# Patient Record
Sex: Female | Born: 2000 | Race: White | Hispanic: No | Marital: Single | Smoking: Never smoker
Health system: Southern US, Community
[De-identification: ages and names within clinical notes are randomized; demographics above are authoritative.]

## PROBLEM LIST (undated history)

## (undated) DIAGNOSIS — B379 Candidiasis, unspecified: Secondary | ICD-10-CM

## (undated) DIAGNOSIS — R112 Nausea with vomiting, unspecified: Secondary | ICD-10-CM

## (undated) DIAGNOSIS — F419 Anxiety disorder, unspecified: Secondary | ICD-10-CM

## (undated) DIAGNOSIS — F32A Depression, unspecified: Secondary | ICD-10-CM

## (undated) DIAGNOSIS — E559 Vitamin D deficiency, unspecified: Secondary | ICD-10-CM

## (undated) DIAGNOSIS — N83209 Unspecified ovarian cyst, unspecified side: Secondary | ICD-10-CM

## (undated) DIAGNOSIS — Z9889 Other specified postprocedural states: Secondary | ICD-10-CM

## (undated) DIAGNOSIS — F909 Attention-deficit hyperactivity disorder, unspecified type: Secondary | ICD-10-CM

## (undated) DIAGNOSIS — N898 Other specified noninflammatory disorders of vagina: Secondary | ICD-10-CM

## (undated) DIAGNOSIS — F329 Major depressive disorder, single episode, unspecified: Secondary | ICD-10-CM

## (undated) DIAGNOSIS — N926 Irregular menstruation, unspecified: Secondary | ICD-10-CM

## (undated) HISTORY — PX: ADENOIDECTOMY: SUR15

## (undated) HISTORY — DX: Irregular menstruation, unspecified: N92.6

## (undated) HISTORY — DX: Candidiasis, unspecified: B37.9

## (undated) HISTORY — PX: TONSILLECTOMY: SUR1361

## (undated) HISTORY — DX: Unspecified ovarian cyst, unspecified side: N83.209

## (undated) HISTORY — DX: Vitamin D deficiency, unspecified: E55.9

## (undated) HISTORY — DX: Other specified noninflammatory disorders of vagina: N89.8

---
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
## 2021-08-06 ENCOUNTER — Emergency Department (HOSPITAL_COMMUNITY)
Admission: EM | Admit: 2021-08-06 | Discharge: 2021-08-06 | Disposition: A | Discharge status: Home / Self Care | Payer: Medicaid Other | Attending: Emergency Medicine | Admitting: Emergency Medicine

## 2021-08-06 ENCOUNTER — Emergency Department (HOSPITAL_COMMUNITY): Payer: Medicaid Other

## 2021-08-06 ENCOUNTER — Other Ambulatory Visit: Payer: Self-pay

## 2021-08-06 ENCOUNTER — Encounter (HOSPITAL_COMMUNITY): Payer: Self-pay | Admitting: Emergency Medicine

## 2021-08-06 DIAGNOSIS — R1032 Left lower quadrant pain: Secondary | ICD-10-CM | POA: Diagnosis not present

## 2021-08-06 DIAGNOSIS — D72829 Elevated white blood cell count, unspecified: Secondary | ICD-10-CM | POA: Insufficient documentation

## 2021-08-06 DIAGNOSIS — R110 Nausea: Secondary | ICD-10-CM | POA: Insufficient documentation

## 2021-08-06 DIAGNOSIS — R102 Pelvic and perineal pain: Secondary | ICD-10-CM

## 2021-08-06 DIAGNOSIS — N761 Subacute and chronic vaginitis: Secondary | ICD-10-CM

## 2021-08-06 DIAGNOSIS — J45909 Unspecified asthma, uncomplicated: Secondary | ICD-10-CM | POA: Insufficient documentation

## 2021-08-06 DIAGNOSIS — Z79899 Other long term (current) drug therapy: Secondary | ICD-10-CM | POA: Insufficient documentation

## 2021-08-06 DIAGNOSIS — R1031 Right lower quadrant pain: Secondary | ICD-10-CM | POA: Insufficient documentation

## 2021-08-06 LAB — CBC WITH DIFFERENTIAL/PLATELET
Abs Immature Granulocytes: 0.04 10*3/uL (ref 0.00–0.07)
Basophils Absolute: 0 10*3/uL (ref 0.0–0.1)
Basophils Relative: 0 %
Eosinophils Absolute: 0.1 10*3/uL (ref 0.0–0.5)
Eosinophils Relative: 1 %
HCT: 37.9 % (ref 36.0–46.0)
Hemoglobin: 12.3 g/dL (ref 12.0–15.0)
Immature Granulocytes: 0 %
Lymphocytes Relative: 33 %
Lymphs Abs: 3.8 10*3/uL (ref 0.7–4.0)
MCH: 28 pg (ref 26.0–34.0)
MCHC: 32.5 g/dL (ref 30.0–36.0)
MCV: 86.1 fL (ref 80.0–100.0)
Monocytes Absolute: 1 10*3/uL (ref 0.1–1.0)
Monocytes Relative: 9 %
Neutro Abs: 6.6 10*3/uL (ref 1.7–7.7)
Neutrophils Relative %: 57 %
Platelets: 269 10*3/uL (ref 150–400)
RBC: 4.4 MIL/uL (ref 3.87–5.11)
RDW: 13.6 % (ref 11.5–15.5)
WBC: 11.5 10*3/uL — ABNORMAL HIGH (ref 4.0–10.5)
nRBC: 0 % (ref 0.0–0.2)

## 2021-08-06 LAB — COMPREHENSIVE METABOLIC PANEL
ALT: 43 U/L (ref 0–44)
AST: 27 U/L (ref 15–41)
Albumin: 3.8 g/dL (ref 3.5–5.0)
Alkaline Phosphatase: 66 U/L (ref 38–126)
Anion gap: 8 (ref 5–15)
BUN: 15 mg/dL (ref 6–20)
CO2: 24 mmol/L (ref 22–32)
Calcium: 8.9 mg/dL (ref 8.9–10.3)
Chloride: 106 mmol/L (ref 98–111)
Creatinine, Ser: 0.63 mg/dL (ref 0.44–1.00)
GFR, Estimated: >60 mL/min (ref >60)
Glucose, Bld: 99 mg/dL (ref 70–99)
Potassium: 3.7 mmol/L (ref 3.5–5.1)
Sodium: 138 mmol/L (ref 135–145)
Total Bilirubin: 0.6 mg/dL (ref 0.3–1.2)
Total Protein: 7.5 g/dL (ref 6.5–8.1)

## 2021-08-06 LAB — URINALYSIS, ROUTINE W REFLEX MICROSCOPIC
Bilirubin Urine: NEGATIVE
Glucose, UA: NEGATIVE mg/dL
Ketones, ur: NEGATIVE mg/dL
Leukocytes,Ua: NEGATIVE
Nitrite: NEGATIVE
Protein, ur: NEGATIVE mg/dL
Specific Gravity, Urine: 1.018 (ref 1.005–1.030)
pH: 7 (ref 5.0–8.0)

## 2021-08-06 LAB — WET PREP, GENITAL
Sperm: NONE SEEN
Trich, Wet Prep: NONE SEEN
Yeast Wet Prep HPF POC: NONE SEEN

## 2021-08-06 LAB — GC/CHLAMYDIA PROBE AMP (~~LOC~~) NOT AT ARMC
Chlamydia: NEGATIVE
Comment: NEGATIVE
Comment: NORMAL
Neisseria Gonorrhea: NEGATIVE

## 2021-08-06 LAB — PREGNANCY, URINE: Preg Test, Ur: NEGATIVE

## 2021-08-06 LAB — LIPASE, BLOOD: Lipase: 35 U/L (ref 11–51)

## 2021-08-06 MED ORDER — FENTANYL CITRATE PF 50 MCG/ML IJ SOSY
50.0000 ug | PREFILLED_SYRINGE | Freq: Once | INTRAMUSCULAR | Status: AC
  Administered 2021-08-06: 50 ug via INTRAVENOUS
  Filled 2021-08-06: qty 1

## 2021-08-06 MED ORDER — ONDANSETRON HCL 4 MG/2ML IJ SOLN
4.0000 mg | Freq: Once | INTRAMUSCULAR | Status: AC
Start: 2021-08-06 — End: 2021-08-06
  Administered 2021-08-06: 4 mg via INTRAVENOUS
  Filled 2021-08-06: qty 2

## 2021-08-06 MED ORDER — IOHEXOL 300 MG/ML  SOLN
100.0000 mL | Freq: Once | INTRAMUSCULAR | Status: AC | PRN
  Administered 2021-08-06: 100 mL via INTRAVENOUS

## 2021-08-06 MED ORDER — LACTATED RINGERS IV BOLUS
1000.0000 mL | Freq: Once | INTRAVENOUS | Status: AC
  Administered 2021-08-06: 1000 mL via INTRAVENOUS

## 2021-08-06 MED ORDER — KETOROLAC TROMETHAMINE 30 MG/ML IJ SOLN
30.0000 mg | Freq: Once | INTRAMUSCULAR | Status: AC
Start: 2021-08-06 — End: 2021-08-06
  Administered 2021-08-06: 30 mg via INTRAVENOUS
  Filled 2021-08-06: qty 1

## 2021-08-06 MED ORDER — METRONIDAZOLE 500 MG PO TABS: 500.0000 mg | ORAL_TABLET | Freq: Two times a day (BID) | ORAL | 0 refills | Status: AC

## 2021-08-06 NOTE — ED Notes (Signed)
Patient transported to CT 

## 2021-08-06 NOTE — Discharge Instructions (Signed)
Follow-up with your GYN doctor next week for recheck.  Take Tylenol or Motrin for pain

## 2021-08-06 NOTE — ED Provider Notes (Signed)
[facility] EMERGENCY DEPARTMENT Provider Note   CSN: [id number] Arrival date & time: 08/06/21  0202     History  Chief Complaint  Patient presents with   Abdominal Pain    Kathy Green is a 21 y.o. female.  The history is provided by the patient.  Abdominal Pain Pain location:  LLQ Pain quality: sharp   Pain radiates to:  R flank and RLQ Pain severity:  Severe Onset quality:  Sudden Duration:  2 days Timing:  Constant Progression:  Worsening Chronicity:  New Relieved by:  Nothing Worsened by:  Movement and palpation Associated symptoms: nausea   Associated symptoms: no constipation, no diarrhea, no dysuria, no fever, no hematochezia, no vaginal bleeding and no vomiting   Patient with previous history of ovarian cyst presents with abdominal pain.  She reports over 2 days ago she was at work in the bathroom when she had onset of left lower quadrant pain.  Since that time is radiating to her right side and her right flank.  No fevers or vomiting, but she does report nausea.  She reports has had a normal bowel movement without any blood.  She has never really had this pain before   Past Medical History:  Diagnosis Date   ADHD (attention deficit hyperactivity disorder)    Anxiety    Asthma    Depression    Irregular bleeding 09/13/2015   Ovarian cyst    PONV (postoperative nausea and vomiting)    Vaginal discharge 09/13/2015   Vitamin D deficiency 09/17/2015   Yeast infection 09/13/2015    Home Medications Prior to Admission medications   Medication Sig Start Date End Date Taking? Authorizing Provider  acetaminophen (TYLENOL) 325 MG tablet Take 2 tablets (650 mg total) by mouth every 6 (six) hours as needed for mild pain or moderate pain. 12/29/18   Reasor, [place], MD  albuterol (PROAIR HFA) 108 (90 BASE) MCG/ACT inhaler Inhale 2 puffs into the lungs every 6 (six) hours as needed for wheezing or shortness of breath.     [provider]  diphenhydrAMINE  (BENADRYL) 25 MG tablet Take 1 tablet (25 mg total) by mouth every 6 (six) hours as needed for itching. Patient taking differently: Take 25 mg by mouth every 6 (six) hours as needed for allergies.  11/15/17   Loren Racer, MD  docusate sodium (COLACE) 100 MG capsule Take 1 capsule (100 mg total) by mouth 2 (two) times daily. 12/30/18   Reasor, [place], MD  ibuprofen (ADVIL) 800 MG tablet Take 1 tablet (800 mg total) by mouth every 8 (eight) hours as needed. 01/23/19   Bethann Berkshire, MD  naproxen (NAPROSYN) 500 MG tablet Take 1 tablet (500 mg total) by mouth 2 (two) times daily with a meal. 05/09/19   Eber Hong, MD  pantoprazole (PROTONIX) 40 MG tablet Take 1 tablet (40 mg total) by mouth daily. 01/02/19 02/01/19  Alexander Mt, MD      Allergies    Amoxicillin-pot clavulanate and Penicillins    Review of Systems   Review of Systems  Constitutional:  Negative for fever.  Gastrointestinal:  Positive for abdominal pain and nausea. Negative for constipation, diarrhea, hematochezia and vomiting.  Genitourinary:  Negative for dysuria and vaginal bleeding.  All other systems reviewed and are negative.  Physical Exam Updated Vital Signs BP 105/61 (BP Location: Right Arm)    Pulse 95    Temp 98 F (36.7 C) (Oral)    Resp 15    Ht 1.702  m (5\' 7" )    Wt 132 kg    LMP 07/20/2021    SpO2 96%    BMI 45.58 kg/m  Physical Exam CONSTITUTIONAL: Well developed/well nourished, uncomfortable appearing HEAD: Normocephalic/atraumatic EYES: EOMI/PERRL ENMT: Mucous membranes moist NECK: supple no meningeal signs SPINE/BACK:entire spine nontender CV: S1/S2 noted, no murmurs/rubs/gallops noted LUNGS: Lungs are clear to auscultation bilaterally, no apparent distress ABDOMEN: soft, moderate LLQ and RLQ tenderness, no rebound or guarding, bowel sounds noted throughout abdomen GU: Right cva tenderness Pelvic with nurse present, reveals no CMT, whitish discharge but no vaginal bleeding.  Right adnexal  tenderness.  Exam limited by body habitus NEURO: Pt is awake/alert/appropriate, moves all extremitiesx4.  No facial droop.   EXTREMITIES: pulses normal/equal, full ROM SKIN: warm, color normal PSYCH: no abnormalities of mood noted, alert and oriented to situation  ED Results / Procedures / Treatments   Labs (all labs ordered are listed, but only abnormal results are displayed) Labs Reviewed  WET PREP, GENITAL - Abnormal; Notable for the following components:      Result Value   Clue Cells Wet Prep HPF POC PRESENT (*)    WBC, Wet Prep HPF POC FEW (*)    All other components within normal limits  CBC WITH DIFFERENTIAL/PLATELET - Abnormal; Notable for the following components:   WBC 11.5 (*)    All other components within normal limits  URINALYSIS, ROUTINE W REFLEX MICROSCOPIC - Abnormal; Notable for the following components:   Hgb urine dipstick SMALL (*)    Bacteria, UA FEW (*)    All other components within normal limits  COMPREHENSIVE METABOLIC PANEL  LIPASE, BLOOD  PREGNANCY, URINE  GC/CHLAMYDIA PROBE AMP ([employer]) NOT AT [facility]    EKG None  Radiology CT ABDOMEN PELVIS W CONTRAST  Result Date: 08/06/2021 CLINICAL DATA:  Lower right abdominal pain radiating across the abdomen since Sunday with nausea EXAM: CT ABDOMEN AND PELVIS WITH CONTRAST TECHNIQUE: Multidetector CT imaging of the abdomen and pelvis was performed using the standard protocol following bolus administration of intravenous contrast. RADIATION DOSE REDUCTION: This exam was performed according to the departmental dose-optimization program which includes automated exposure control, adjustment of the mA and/or kV according to patient size and/or use of iterative reconstruction technique. CONTRAST:  Thursday OMNIPAQUE IOHEXOL 300 MG/ML  SOLN COMPARISON:  12/23/2018 abdominal CT FINDINGS: Lower chest:  No contributory findings. Hepatobiliary: No focal liver abnormality.No evidence of biliary obstruction or stone. Pancreas:  Unremarkable. Spleen: Unremarkable. Adrenals/Urinary Tract: Negative adrenals. No hydronephrosis or stone. Unremarkable bladder. Stomach/Bowel:  No obstruction. No appendicitis. Vascular/Lymphatic: No acute vascular abnormality. No mass or adenopathy. Reproductive:3 cm probable dominant follicle on the left, contralateral to the symptomatic side. Other: No ascites or pneumoperitoneum. Musculoskeletal: No acute abnormalities. IMPRESSION: Negative for appendicitis or other cause of symptoms. Electronically Signed   By: 12/25/2018 M.D.   On: 08/06/2021 04:51    Procedures Procedures    Medications Ordered in ED Medications  ketorolac (TORADOL) 30 MG/ML injection 30 mg (has no administration in time range)  fentaNYL (SUBLIMAZE) injection 50 mcg (50 mcg Intravenous Given 08/06/21 0423)  ondansetron (ZOFRAN) injection 4 mg (4 mg Intravenous Given 08/06/21 0421)  lactated ringers bolus 1,000 mL (1,000 mLs Intravenous New Bag/Given 08/06/21 0426)  iohexol (OMNIPAQUE) 300 MG/ML solution 100 mL (100 mLs Intravenous Contrast Given 08/06/21 0430)    ED Course/ Medical Decision Making/ A&P Clinical Course as of 08/06/21 0707  Wed Aug 06, 2021  0408 WBC(!): 11.5 Leukocytosis [DW]  [phone]  CT scan was personally reviewed and is negative for acute abdominal emergency.  Patient is still having abdominal pain.  Pelvic exam was performed with nurse present, revealed right adnexal tenderness.  No CMT.  Plan for pelvic ultrasound to evaluate for TOA/torsion [DW]  0706 SIGNED OUT TO Dr zammit at shift change to f/u on US pelvis [DW]    Clinical Course User Index [DW] Zadie Rhine, MD                           Medical Decision Making Amount and/or Complexity of Data Reviewed Labs: ordered. Decision-making details documented in ED Course. Radiology: ordered. ECG/medicine tests: ordered.  Risk Prescription drug management.   This patient presents to the ED for concern of abdominal pain, this involves an  extensive number of treatment options, and is a complaint that carries with it a high risk of complications and morbidity.  The differential diagnosis includes ovarian cyst, cyst rupture, TOA, ovarian torsion, UTI, pyelonephritis, kidney stone, appendicitis, bowel obstruction, diverticulitis  Comorbidities that complicate the patient evaluation: Patients presentation is complicated by their history of ovarian cyst and obesity  Additional history obtained: Additional history obtained from  mother Records reviewed previous radiology  Lab Tests: I Ordered, and personally interpreted labs.  The pertinent results include: Leukocytosis  Imaging Studies ordered: I ordered imaging studies including CT scan abdomen/pelvis I independently visualized and interpreted imaging which showed no acute findings I agree with the radiologist interpretation    Medicines ordered and prescription drug management: I ordered medication including IV fentanyl for pain Reevaluation of the patient after these medicines showed that the patient    improved  Reevaluation: After the interventions noted above, I reevaluated the patient and found that they have :improved  Complexity of problems addressed: Patients presentation is most consistent with  acute presentation with potential threat to life or bodily function              Final Clinical Impression(s) / ED Diagnoses Final diagnoses:  Left lower quadrant abdominal pain  Pelvic pain    Rx / DC Orders ED Discharge Orders     None         Zadie Rhine, MD 08/06/21 [phone]

## 2021-08-06 NOTE — ED Triage Notes (Signed)
Pt c/o lower right abd pain that radiates across abd since Sunday with nausea.

## 2021-08-29 ENCOUNTER — Other Ambulatory Visit (HOSPITAL_COMMUNITY): Payer: Self-pay | Admitting: Orthopedic Surgery

## 2021-08-29 ENCOUNTER — Other Ambulatory Visit: Payer: Self-pay

## 2021-08-29 ENCOUNTER — Ambulatory Visit (HOSPITAL_COMMUNITY)
Admission: RE | Admit: 2021-08-29 | Discharge: 2021-08-29 | Disposition: A | Discharge status: Home / Self Care | Payer: Medicaid Other | Source: Ambulatory Visit | Attending: Orthopedic Surgery | Admitting: Orthopedic Surgery

## 2021-08-29 DIAGNOSIS — M7989 Other specified soft tissue disorders: Secondary | ICD-10-CM

## 2021-08-29 DIAGNOSIS — M79605 Pain in left leg: Secondary | ICD-10-CM | POA: Insufficient documentation

## 2021-08-29 NOTE — Progress Notes (Signed)
Lower extremity venous LT study completed.   Please see CV Proc for preliminary results.   Eliodoro Gullett, RDMS, RVT  

## 2021-09-17 NOTE — Therapy (Addendum)
?OUTPATIENT PHYSICAL THERAPY LOWER EXTREMITY EVALUATION ? ? ?Patient Name: Kathy Green ?MRN: [id number] ?DOB:[birth year], 20 y.o., female ?Today's Date: 09/18/2021 ? ? PT End of Session - 09/18/21 [id number]   ? ? Visit Number 1   ? Number of Visits 8   ? Date for PT Re-Evaluation 10/17/21   ? Authorization Type [place] [employer] Healthy   ? Authorization - Visit Number 1   ? Authorization - Number of Visits 8   ? Progress Note Due on Visit 8   ? PT Start Time [phone]   ? PT Stop Time 1030   ? PT Time Calculation (min) 36 min   ? ?  ?  ? ?  ? ? ?Past Medical History:  ?Diagnosis Date  ? ADHD (attention deficit hyperactivity disorder)   ? Anxiety   ? Asthma   ? Depression   ? Irregular bleeding 09/13/2015  ? Ovarian cyst   ? PONV (postoperative nausea and vomiting)   ? Vaginal discharge 09/13/2015  ? Vitamin D deficiency 09/17/2015  ? Yeast infection 09/13/2015  ? ?Past Surgical History:  ?Procedure Laterality Date  ? ADENOIDECTOMY    ? TONSILLECTOMY    ? TOOTH EXTRACTION Bilateral 09/09/2018  ? Procedure: EXTRACTION 3RD MOLARS;  Surgeon: Diona Browner, DDS;  Location: [place];  Service: Oral Surgery;  Laterality: Bilateral;  EXTRACTION 3RD MOLARS  ? ?Patient Active Problem List  ? Diagnosis Date Noted  ? Constipation 01/01/2019  ? Vomiting 12/28/2018  ? Abdominal pain 12/28/2018  ? MDD (major depressive disorder), recurrent episode, severe ([place]) 08/04/2018  ? Vitamin D deficiency 09/17/2015  ? Vaginal discharge 09/13/2015  ? Yeast infection 09/13/2015  ? Irregular bleeding 09/13/2015  ? GROIN PAIN 03/13/2010  ? 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 ? ?PCP: Sharilyn Sites, MD ? ?REFERRING PROVIDER: Earlie Server, MD ? ?REFERRING DIAG: Left knee pain ? ?THERAPY DIAG: PT eval and tx Left knee pain ? PF _____  vs lateral menisous per Dr French Ana  ?Acute pain of left knee ? ?Difficulty in walking, not elsewhere classified ? ?ONSET DATE: Feb 4th, 2023 ? ?SUBJECTIVE:  ? ?SUBJECTIVE STATEMENT: ?Patient states she was stepping out of the shower and stepped  in a wet spot and her knee went behind her and hyperextended.  She states she felt a "pop" and had immediate pain. She is wearing a brace today.  She states she went to [facility] as she is familiar with them about 2 weeks after the injury. X-ray was negative for fracture and is waiting for MRI. Follows up with [facility] 10/06/21 ? ?PERTINENT HISTORY: ?Had some knee pain from Left ankle fracture at 21 years old ? ?PAIN:  ?Are you having pain? Yes: NPRS scale: 8/10 ?Pain location: L knee pain, anterior ?Pain description: throbbing, stinging ?Aggravating factors: walking ?Relieving factors: laying down, sleeping ? ?PRECAUTIONS: Fall ? ?WEIGHT BEARING RESTRICTIONS No ? ?FALLS:  ?Has patient fallen in last 6 months? Yes, Number of falls: 1 ? ?LIVING ENVIRONMENT: ?Lives with: lives with their family ?Lives in: Mobile home ?Stairs: Yes: External: 4 steps; can reach both ?Has following equipment at home: None ? ?OCCUPATION: currently not working ? ?PLOF: Independent ? ?PATIENT GOALS to get out of the brace and back to normal ? ? ?OBJECTIVE:  ? ?DIAGNOSTIC FINDINGS: xray; neg ? ? ?COGNITION: ? Overall cognitive status: Within functional limits for tasks assessed   ?  ?SENSATION: ?WFL ? ? ?POSTURE:  ?wfl ? ?PALPATION: ?Tender lateral knee structures; LCL as well as around patella; guarding with  all knee movement.  ? ?LE ROM: ? ?Active ROM Right ?09/18/2021 Left ?09/18/2021  ?Hip flexion    ?Hip extension    ?Hip abduction    ?Hip adduction    ?Hip internal rotation    ?Hip external rotation    ?Knee flexion  82 AAROM supine  ?Knee extension hyperextended -12 supine  ?Ankle dorsiflexion    ?Ankle plantarflexion    ?Ankle inversion    ?Ankle eversion    ? (Blank rows = not tested) ? ?LE MMT: ? ?MMT Right ?09/18/2021 Left ?09/18/2021  ?Hip flexion 5 3+  ?Hip extension    ?Hip abduction    ?Hip adduction    ?Hip internal rotation    ?Hip external rotation    ?Knee flexion 5 3-  ?Knee extension 5 3-  ?Ankle dorsiflexion 5 4  ?Ankle  plantarflexion    ?Ankle inversion    ?Ankle eversion    ? (Blank rows = not tested) ? ? ?FUNCTIONAL TESTS:  ?30 seconds chair stand test x 3 ? ?GAIT: ?Distance walked: 50 ?Assistive device utilized: None ?Level of assistance: SBA ?Comments: antalgic gait; decreased stance Left lower extremity; decreased gait speed ? ? ? ?TODAY'S TREATMENT: ?PT evaluation, HEP instruction ? ? ?PATIENT EDUCATION:  ?Education details: HEP, use of ice for pain management ?Person educated: Patient ?Education method: Explanation, Demonstration, and Handouts ?Education comprehension: verbalized understanding, returned demonstration, and needs further education ? ? ?HOME EXERCISE PROGRAM: ? ?Ankle pumps, quad sets, heel slides in supine and sitting ? ?ASSESSMENT: ? ?CLINICAL IMPRESSION: ?Patient is a 21 y.o. female who was seen today for physical therapy evaluation and treatment for Left knee pain.  ? ? ?OBJECTIVE IMPAIRMENTS Abnormal gait, decreased activity tolerance, decreased balance, decreased endurance, decreased mobility, difficulty walking, decreased ROM, decreased strength, increased edema, impaired perceived functional ability, impaired flexibility, and pain.  ? ?ACTIVITY LIMITATIONS cleaning, community activity, driving, meal prep, occupation, laundry, yard work, shopping, and yard work.  ? ?PERSONAL FACTORS Fitness are also affecting patient's functional outcome.  ? ? ?REHAB POTENTIAL: Good ? ?CLINICAL DECISION MAKING: Stable/uncomplicated ? ?EVALUATION COMPLEXITY: Low ? ? ?GOALS: ?Goals reviewed with patient? Yes ? ?SHORT TERM GOALS: Target date: 10/02/2021 ? ?Patient will be independent in initial HEP to improve functional outcomes. ? ?Goal status: INITIAL ? ?2.  Patient will improve Left knee AAROM flexion in supine to 100 degrees to improve ability to squat, navigate steps ?Baseline: Left knee AAROM flexion 82 degrees in supine ?Goal status: INITIAL ? ?3.  Patient will improve Left knee AROM extension to -8 in supine to  improve ability to fire quad to decrease fall risk with stance phase of ambulation.  ?Baseline: Left knee AROM extension -12 in supine ?Goal status: INITIAL ? ? ?LONG TERM GOALS: Target date: 10/16/2021 ? ?Long term goal: Patient will be independent with advanced HEP and self management strategies to improve quality of life and functional outcomes. ? ? ?Goal status: INITIAL ? ?2.   Patient with improved Left  knee AROM to 0-120 to be able to safely navigate stairs ? ?Baseline: -12 to 82 ?Goal status: INITIAL ? ?3.  Long term goal: Patient will increase Left knee MMTs to 5/5 to promote return to ambulation community distances with minimal deviation. ?Baseline: 3-/5 KF/KE ?Goal status: INITIAL ? ?4.  Patient will improve 30 sec STS score from 3 to 6 to demonstrate increased lower extremity strength and improved functional mobilty  ?Baseline: 30 sec STS x 3 ?Goal status: INITIAL ? ? ?PLAN: ?PT FREQUENCY:  2x/week ? ?PT DURATION: 4 weeks ? ?PLANNED INTERVENTIONS: Therapeutic exercises, Therapeutic activity, Neuromuscular re-education, Balance training, Gait training, Patient/Family education, Joint manipulation, Joint mobilization, Stair training, Orthotic/Fit training, DME instructions, Aquatic Therapy, Electrical stimulation, Spinal manipulation, Spinal mobilization, Cryotherapy, Taping, Ultrasound, Biofeedback, Ionotophoresis 4mg /ml Dexamethasone, and Manual therapy ? ?PLAN FOR NEXT SESSION: Review of HEP and goals, progress Left knee mobility and strength as able.  ? ? ?11:41 AM, 09/18/21 ?Novice Vrba Small Hilari Wethington MPT ?[facility] ?[place] [phone] ?Ph:[phone] ? ?

## 2021-09-18 ENCOUNTER — Ambulatory Visit (HOSPITAL_COMMUNITY): Payer: Medicaid Other | Attending: Orthopedic Surgery

## 2021-09-18 ENCOUNTER — Other Ambulatory Visit: Payer: Self-pay

## 2021-09-18 DIAGNOSIS — R262 Difficulty in walking, not elsewhere classified: Secondary | ICD-10-CM | POA: Diagnosis present

## 2021-09-18 DIAGNOSIS — M25562 Pain in left knee: Secondary | ICD-10-CM | POA: Diagnosis not present

## 2021-09-18 NOTE — Addendum Note (Signed)
Addended byKarn Cassis S on: 09/18/2021 11:48 AM ? ? Modules accepted: Orders ? ?

## 2021-09-22 ENCOUNTER — Other Ambulatory Visit: Payer: Self-pay

## 2021-09-22 ENCOUNTER — Ambulatory Visit (HOSPITAL_COMMUNITY): Payer: Medicaid Other | Admitting: Physical Therapy

## 2021-09-22 DIAGNOSIS — M25562 Pain in left knee: Secondary | ICD-10-CM | POA: Diagnosis not present

## 2021-09-22 NOTE — Therapy (Signed)
?OUTPATIENT PHYSICAL THERAPY TREATMENT NOTE ? ? ?Patient Name: Kathy Green ?MRN: [id number] ?DOB:04/29/2001, 21 y.o., female ?Today's Date: 09/22/2021 ? ?PCP: Assunta Found, MD ?REFERRING PROVIDER: Assunta Found, MD ? ? PT End of Session - 09/22/21 1124   ? ? Visit Number 2   ? Number of Visits 8   ? Date for PT Re-Evaluation 10/17/21   ? Authorization Type [place] Medicaid Healthy  auth not in at this time   ? Authorization - Visit Number 2   ? Authorization - Number of Visits 8   ? Progress Note Due on Visit 8   ? PT Start Time 1450   ? PT Stop Time 1530   ? PT Time Calculation (min) 40 min   ? ?  ?  ? ?  ? ? ?Past Medical History:  ?Diagnosis Date  ? ADHD (attention deficit hyperactivity disorder)   ? Anxiety   ? Asthma   ? Depression   ? Irregular bleeding 09/13/2015  ? Ovarian cyst   ? PONV (postoperative nausea and vomiting)   ? Vaginal discharge 09/13/2015  ? Vitamin D deficiency 09/17/2015  ? Yeast infection 09/13/2015  ? ?Past Surgical History:  ?Procedure Laterality Date  ? ADENOIDECTOMY    ? TONSILLECTOMY    ? TOOTH EXTRACTION Bilateral 09/09/2018  ? Procedure: EXTRACTION 3RD MOLARS;  Surgeon: Ocie Doyne, DDS;  [facility]: [facility];  Service: Oral Surgery;  Laterality: Bilateral;  EXTRACTION 3RD MOLARS  ? ?Patient Active Problem List  ? Diagnosis Date Noted  ? Constipation 01/01/2019  ? Vomiting 12/28/2018  ? Abdominal pain 12/28/2018  ? MDD (major depressive disorder), recurrent episode, severe ([facility]) 08/04/2018  ? Vitamin D deficiency 09/17/2015  ? Vaginal discharge 09/13/2015  ? Yeast infection 09/13/2015  ? Irregular bleeding 09/13/2015  ? GROIN PAIN 03/13/2010  ? 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 ? ?REFERRING DIAG: Left knee pain ?  ? ?THERAPY DIAG:  ?Left knee pain ? ?PERTINENT HISTORY: Had some knee pain from Left ankle fracture at 21 years old ?  ? ?PRECAUTIONS: fall ? ?SUBJECTIVE: PT states that she did her exercises once a day.  She is hurting today. ? ?PAIN:  ?Are you having pain? Yes: NPRS scale: 8/10 ?Pain  location: knee ?Pain description: throbbing  ?Aggravating factors: activity  ?Relieving factors: ice  ? ? ? ? ?  ?  ?TODAY'S TREATMENT: ?            Bike x 5' ?            Standing heel raises x 10 ?                           Mini squat x 10 ?            Sitting     LAQ x 10 ?                           Sit to stand x 10 ?            Supine:  quad set x 10 ?                          Heelslide x 10 ?                          SLR  x 10  ?  SAQ x 10  ?           Side lying hip abduction x 10  ?                           Hip adduction x 10  ?           Prone knee flexion x10 ?                      Hip extension x 10 ?          ?  ?PATIENT EDUCATION:  ?Education details: HEP, use of ice for pain management ?Person educated: Patient ?Education method: Explanation, Demonstration, and Handouts ?Education comprehension: verbalized understanding, returned demonstration, and needs further education ?  ?  ?HOME EXERCISE PROGRAM: ?  ?Ankle pumps, quad sets, heel slides in supine and sitting ?3/27:  Standing heel raises ?                           Mini squat ?            Sitting     LAQ  ?            Supine:   SAQ x 10  ?                          SLR  x 10  ?                            ?ASSESSMENT: ?  ?CLINICAL IMPRESSION: ?Evaluation and goals reviewed with patient.  Pt HEP progressed to increase strength. Current AROM 0-118. ?  ?  ?OBJECTIVE IMPAIRMENTS Abnormal gait, decreased activity tolerance, decreased balance, decreased endurance, decreased mobility, difficulty walking, decreased ROM, decreased strength, increased edema, impaired perceived functional ability, impaired flexibility, and pain.  ?  ?ACTIVITY LIMITATIONS cleaning, community activity, driving, meal prep, occupation, laundry, yard work, shopping, and yard work.  ?  ?PERSONAL FACTORS Fitness are also affecting patient's functional outcome.  ?  ?  ?REHAB POTENTIAL: Good ?  ?CLINICAL DECISION MAKING: Stable/uncomplicated ?  ?EVALUATION  COMPLEXITY: Low ?  ?  ?GOALS: ?Goals reviewed with patient? Yes ?  ?SHORT TERM GOALS: Target date: 10/02/2021 ?  ?Patient will be independent in initial HEP to improve functional outcomes. ?  ?Goal status: achieved ?  ?2.  Patient will improve Left knee AAROM flexion in supine to 100 degrees to improve ability to squat, navigate steps ?Baseline: Left knee AAROM flexion 82 degrees in supine ?Goal status: achieved ?  ?3.  Patient will improve Left knee AROM extension to -8 in supine to improve ability to fire quad to decrease fall risk with stance phase of ambulation.  ?Baseline: Left knee AROM extension -12 in supine ?Goal status: achieved  ?  ?  ?LONG TERM GOALS: Target date: 10/16/2021 ?  ?Long term goal: Patient will be independent with advanced HEP and self management strategies to improve quality of life and functional outcomes. ?  ?  ?Goal status: ongoing ?  ?2.   Patient with improved Left  knee AROM to 0-120 to be able to safely navigate stairs ?  ?Baseline: -12 to 82 ?Goal status: ongoing  ?  ?3.  Long term goal: Patient will increase Left knee MMTs to 5/5 to promote return to ambulation community  distances with minimal deviation. ?Baseline: 3-/5 KF/KE ?Goal status: INITIAL ?  ?4.  Patient will improve 30 sec STS score from 3 to 6 to demonstrate increased lower extremity strength and improved functional mobilty  ?Baseline: 30 sec STS x 3 ?Goal status: achieved  ?  ?  ?PLAN: ?PT FREQUENCY: 2x/week ?  ?PT DURATION: 4 weeks ?  ?PLANNED INTERVENTIONS: Therapeutic exercises, Therapeutic activity, Neuromuscular re-education, Balance training, Gait training, Patient/Family education, Joint manipulation, Joint mobilization, Stair training, Orthotic/Fit training, DME instructions, Aquatic Therapy, Electrical stimulation, Spinal manipulation, Spinal mobilization, Cryotherapy, Taping, Ultrasound, Biofeedback, Ionotophoresis 4mg /ml Dexamethasone, and Manual therapy ?  ?PLAN FOR NEXT SESSION: progress Left knee mobility and  strength as able.  ?  ?  ? ?Virgina OrganCynthia Hanadi Stanly, PT CLT ?[phone]  ?09/22/2021, 3:30PM ? ?   ?

## 2021-09-25 ENCOUNTER — Ambulatory Visit (HOSPITAL_COMMUNITY): Payer: Medicaid Other | Admitting: Physical Therapy

## 2021-09-30 ENCOUNTER — Telehealth (HOSPITAL_COMMUNITY): Payer: Self-pay | Admitting: Physical Therapy

## 2021-09-30 ENCOUNTER — Encounter (HOSPITAL_COMMUNITY): Payer: Medicaid Other | Admitting: Physical Therapy

## 2021-10-01 ENCOUNTER — Ambulatory Visit (HOSPITAL_COMMUNITY): Payer: Medicaid Other | Attending: Orthopedic Surgery | Admitting: Physical Therapy

## 2021-10-01 DIAGNOSIS — R262 Difficulty in walking, not elsewhere classified: Secondary | ICD-10-CM | POA: Insufficient documentation

## 2021-10-01 DIAGNOSIS — M25562 Pain in left knee: Secondary | ICD-10-CM | POA: Diagnosis present

## 2021-10-01 NOTE — Therapy (Signed)
?OUTPATIENT PHYSICAL THERAPY TREATMENT NOTE ? ? ?Patient Name: Kathy Green ?MRN: [id number] ?DOB:August 10, 2000, 21 y.o., female ?Today's Date: 10/01/2021 ? ?PCP: Assunta Found, MD ?REFERRING PROVIDER: Frederico Hamman, MD ? ? PT End of Session - 10/01/21 1554   ? ? Visit Number 3   ? Number of Visits 8   ? Date for PT Re-Evaluation 10/17/21   ? Authorization Type [place] Medicaid Healthy  auth not in at this time   ? Authorization - Visit Number 3   ? Authorization - Number of Visits 8   ? Progress Note Due on Visit 8   ? PT Start Time 1557   ? PT Stop Time 1640   ? PT Time Calculation (min) 43 min   ? ?  ?  ? ?  ? ? ?Past Medical History:  ?Diagnosis Date  ? ADHD (attention deficit hyperactivity disorder)   ? Anxiety   ? Asthma   ? Depression   ? Irregular bleeding 09/13/2015  ? Ovarian cyst   ? PONV (postoperative nausea and vomiting)   ? Vaginal discharge 09/13/2015  ? Vitamin D deficiency 09/17/2015  ? Yeast infection 09/13/2015  ? ?Past Surgical History:  ?Procedure Laterality Date  ? ADENOIDECTOMY    ? TONSILLECTOMY    ? TOOTH EXTRACTION Bilateral 09/09/2018  ? Procedure: EXTRACTION 3RD MOLARS;  Surgeon: Ocie Doyne, DDS;  Location: [facility] OR;  Service: Oral Surgery;  Laterality: Bilateral;  EXTRACTION 3RD MOLARS  ? ?Patient Active Problem List  ? Diagnosis Date Noted  ? Constipation 01/01/2019  ? Vomiting 12/28/2018  ? Abdominal pain 12/28/2018  ? MDD (major depressive disorder), recurrent episode, severe ([facility]) 08/04/2018  ? Vitamin D deficiency 09/17/2015  ? Vaginal discharge 09/13/2015  ? Yeast infection 09/13/2015  ? Irregular bleeding 09/13/2015  ? GROIN PAIN 03/13/2010  ? 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 ? ?REFERRING DIAG: Left knee pain ?  ? ?THERAPY DIAG:  ?Left knee pain ? ?PERTINENT HISTORY: Had some knee pain from Left ankle fracture at 21 years old ?  ? ?PRECAUTIONS: fall ? ?SUBJECTIVE: PT states she is having a lot of pain today and the brace will not stay on because the velcro will not hold.  States she returns to [facility]  Monday.  ? ?OBJECTIVE: ? ?PAIN:  ?Are you having pain? Yes: NPRS scale: 10/10 ?Pain location: Lt Knee ?Pain description: throbbing  ?Aggravating factors: activity  ?Relieving factors: ice  ? ? ?  ?  ?TODAY'S TREATMENT: ?       10/01/21 ? Bike full revolutions 5 minutes ? Standing: Heelraises 20X   ?  Knee flexion Lt 15X ?  Hip abduction bil 15X ?  Hip extension bil 15X ?  High march hold 3" 15X ? Supine: bridge 10X ?  SLR 10X ?  Heelslides 10X ?AROM 0-115 Lt LE ? ?09/22/21 ?Bike x 5' ?            Standing heel raises x 10 ?                           Mini squat x 10 ?            Sitting     LAQ x 10 ?                           Sit to stand x 10 ?  Supine:  quad set x 10 ?                          Heelslide x 10 ?                          SLR  x 10  ?                           SAQ x 10  ?           Side lying hip abduction x 10  ?                           Hip adduction x 10  ?           Prone knee flexion x10 ?                      Hip extension x 10 ?          ?  ?PATIENT EDUCATION:  ?Education details: HEP, use of ice for pain management ?Person educated: Patient ?Education method: Explanation, Demonstration, and Handouts ?Education comprehension: verbalized understanding, returned demonstration, and needs further education ?  ?  ?HOME EXERCISE PROGRAM: ?  ?3/27:  Standing heel raises ?                           Mini squat ?            Sitting     LAQ  ?            Supine:   SAQ x 10  ?                          SLR  x 10  ?At evalution: Ankle pumps, quad sets, heel slides in supine and sitting ?                            ?ASSESSMENT: ?  ?CLINICAL IMPRESSION: ?Pt with increased pain this session.  Later in treatment reported she went to the gym following her last PT appt and thinks she overdone it with the lunges as she described completing these ballistically.  Pt unable to complete squats today due to discomfort.  Was able to add lunges and begin hip strengthening in standing as noted weakness per MMT at  evaluation.  Pt has returned to work at [employer] but is able to complete her job in sitting.   ?  ?  ?OBJECTIVE IMPAIRMENTS Abnormal gait, decreased activity tolerance, decreased balance, decreased endurance, decreased mobility, difficulty walking, decreased ROM, decreased strength, increased edema, impaired perceived functional ability, impaired flexibility, and pain.  ?  ?ACTIVITY LIMITATIONS cleaning, community activity, driving, meal prep, occupation, laundry, yard work, shopping, and yard work.  ?  ?PERSONAL FACTORS Fitness are also affecting patient's functional outcome.  ?  ?  ?REHAB POTENTIAL: Good ?  ?CLINICAL DECISION MAKING: Stable/uncomplicated ?  ?EVALUATION COMPLEXITY: Low ?  ?  ?GOALS: ?Goals reviewed with patient? Yes ?  ?SHORT TERM GOALS: Target date: 10/02/2021 ?  ?Patient will be independent in initial HEP to improve functional outcomes. ?  ?Goal status: achieved ?  ?2.  Patient will improve Left knee  AAROM flexion in supine to 100 degrees to improve ability to squat, navigate steps ?Baseline: Left knee AAROM flexion 82 degrees in supine ?Goal status: achieved ?  ?3.  Patient will improve Left knee AROM extension to -8 in supine to improve ability to fire quad to decrease fall risk with stance phase of ambulation.  ?Baseline: Left knee AROM extension -12 in supine ?Goal status: achieved  ?  ?  ?LONG TERM GOALS: Target date: 10/16/2021 ?  ?Long term goal: Patient will be independent with advanced HEP and self management strategies to improve quality of life and functional outcomes. ?  ?  ?Goal status: ongoing ?  ?2.   Patient with improved Left  knee AROM to 0-120 to be able to safely navigate stairs ?  ?Baseline: -12 to 82 ?Goal status: ongoing  ?  ?3.  Long term goal: Patient will increase Left knee MMTs to 5/5 to promote return to ambulation community distances with minimal deviation. ?Baseline: 3-/5 KF/KE ?Goal status: ongoing ?  ?4.  Patient will improve 30 sec STS score from 3 to 6 to  demonstrate increased lower extremity strength and improved functional mobilty  ?Baseline: 30 sec STS x 3 ?Goal status: achieved  ?  ?  ?PLAN: ?PT FREQUENCY: 2x/week ?  ?PT DURATION: 4 weeks ?  ?PLANNED INTERVENTIONS: Therapeutic exercises, Therapeutic activity, Neuromuscular re-education, Balance training, Gait training, Patient/Family education, Joint manipulation, Joint mobilization, Stair training, Orthotic/Fit training, DME instructions, Aquatic Therapy, Electrical stimulation, Spinal manipulation, Spinal mobilization, Cryotherapy, Taping, Ultrasound, Biofeedback, Ionotophoresis 4mg /ml Dexamethasone, and Manual therapy ?  ?PLAN FOR NEXT SESSION: progress Left knee mobility and strength as able.  F/U with MD appt if any changes. ? ?  ?  ?Ammi Hutt Kae HellerB Tylin Stradley, PTA/CLT, WTA ?[phone] ? ?10/01/2021, 3:30PM ? ?   ?

## 2021-10-08 ENCOUNTER — Telehealth (HOSPITAL_COMMUNITY): Payer: Self-pay | Admitting: Physical Therapy

## 2021-10-08 ENCOUNTER — Encounter (HOSPITAL_COMMUNITY): Payer: Medicaid Other | Admitting: Physical Therapy

## 2021-10-09 ENCOUNTER — Ambulatory Visit (HOSPITAL_COMMUNITY): Payer: Medicaid Other

## 2021-10-09 DIAGNOSIS — R262 Difficulty in walking, not elsewhere classified: Secondary | ICD-10-CM

## 2021-10-09 DIAGNOSIS — M25562 Pain in left knee: Secondary | ICD-10-CM | POA: Diagnosis not present

## 2021-10-09 NOTE — Therapy (Signed)
?OUTPATIENT PHYSICAL THERAPY TREATMENT NOTE ? ? ?Patient Name: Kathy Green ?MRN: [id number] ?DOB:08/22/2000, 21 y.o., female ?Today's Date: 10/09/2021 ? ?PCP: Assunta FoundGolding, John, MD ?REFERRING PROVIDER: Frederico Hammanaffrey, Daniel, MD ? ? PT End of Session - 10/09/21 0814   ? ? Visit Number 4   ? Number of Visits 8   ? Date for PT Re-Evaluation 10/17/21   ? Authorization Type [place] Medicaid Healthy  auth not in at this time   ? Authorization - Visit Number 4   ? Authorization - Number of Visits 8   ? Progress Note Due on Visit 8   ? PT Start Time [phone]   ? PT Stop Time 0859   ? PT Time Calculation (min) 47 min   ? ?  ?  ? ?  ? ? ? ?Past Medical History:  ?Diagnosis Date  ? ADHD (attention deficit hyperactivity disorder)   ? Anxiety   ? Asthma   ? Depression   ? Irregular bleeding 09/13/2015  ? Ovarian cyst   ? PONV (postoperative nausea and vomiting)   ? Vaginal discharge 09/13/2015  ? Vitamin D deficiency 09/17/2015  ? Yeast infection 09/13/2015  ? ?Past Surgical History:  ?Procedure Laterality Date  ? ADENOIDECTOMY    ? TONSILLECTOMY    ? TOOTH EXTRACTION Bilateral 09/09/2018  ? Procedure: EXTRACTION 3RD MOLARS;  Surgeon: Ocie DoyneJensen, Scott, DDS;  [facility];  Service: Oral Surgery;  Laterality: Bilateral;  EXTRACTION 3RD MOLARS  ? ?Patient Active Problem List  ? Diagnosis Date Noted  ? Constipation 01/01/2019  ? Vomiting 12/28/2018  ? Abdominal pain 12/28/2018  ? MDD (major depressive disorder), recurrent episode, severe ([facility]) 08/04/2018  ? Vitamin D deficiency 09/17/2015  ? Vaginal discharge 09/13/2015  ? Yeast infection 09/13/2015  ? Irregular bleeding 09/13/2015  ? GROIN PAIN 03/13/2010  ? 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 ? ?REFERRING DIAG: Left knee pain ?  ? ?THERAPY DIAG:  ?Left knee pain ? ?PERTINENT HISTORY: Had some knee pain from Left ankle fracture at 21 years old ?  ? ?PRECAUTIONS: fall ? ?SUBJECTIVE: Patient fell on Monday prior to her MD appointment. She was standing and her knee gave out. Dr. Madelon Lipsaffrey saw her Monday, put her in an  immobilizer and scheduled an MRI for Saturday 4/22. Patient reports she did not sleep at all last night.  "I don't think anything is helping at this point".  ? ? ?OBJECTIVE: ? ?PAIN:  ?Are you having pain? Yes: NPRS scale: 9/10 ?Pain location: Lt Knee ?Pain description: throbbing  ?Aggravating factors: standing, movement, activity  ?Relieving factors: nothing much helping ? ? ?  ?  ?TODAY'S TREATMENT: ?10/09/21 ?Bike 6 min; seat 14 ? ?Supine: ?Heel slides x 2' ?Hip ABD x 2' ?Hip add with ball x 2' ?Hip ABD with gait belt x 2' ?SAQs x 2' ?Bridge x 2' ?SLR x 2' ? ?Sitting ?LAQs x 2' ?Heel/toe raises x 2' ?Hip flexion x 2' ? ?STS 2 x 10 ? ? ? ?       10/01/21 ? Bike full revolutions 5 minutes ? Standing: Heelraises 20X   ?  Knee flexion Lt 15X ?  Hip abduction bil 15X ?  Hip extension bil 15X ?  High march hold 3" 15X ? Supine: bridge 10X ?  SLR 10X ?  Heelslides 10X ?AROM 0-115 Lt LE ? ?09/22/21 ?Bike x 5' ?            Standing heel raises x 10 ?  Mini squat x 10 ?            Sitting     LAQ x 10 ?                           Sit to stand x 10 ?            Supine:  quad set x 10 ?                          Heelslide x 10 ?                          SLR  x 10  ?                           SAQ x 10  ?           Side lying hip abduction x 10  ?                           Hip adduction x 10  ?           Prone knee flexion x10 ?                      Hip extension x 10 ?          ?  ?PATIENT EDUCATION:  ?Education details: HEP, use of ice for pain management ?Person educated: Patient ?Education method: Explanation, Demonstration, and Handouts ?Education comprehension: verbalized understanding, returned demonstration, and needs further education ?  ?  ?HOME EXERCISE PROGRAM: ?  ?3/27:  Standing heel raises ?                           Mini squat ?            Sitting     LAQ  ?            Supine:   SAQ x 10  ?                          SLR  x 10  ?At evalution: Ankle pumps, quad sets, heel slides in supine and  sitting ?                            ?ASSESSMENT: ?  ?CLINICAL IMPRESSION: ?Patient states her boss fired her last Thursday.  She came into therapy with an immobilizer on today.  She reports soreness and pain from her fall Monday.  Today's treatment focused on Left knee mobility and gentle strengthening.  She is able to complete treatment without any buckling of the knee today but reports 10/10 pain after treatment. 2 visits remain on current orders. Patient will benefit from continued skilled therapy interventions to address deficits and improve functional mobility.  ? ? ?OBJECTIVE IMPAIRMENTS Abnormal gait, decreased activity tolerance, decreased balance, decreased endurance, decreased mobility, difficulty walking, decreased ROM, decreased strength, increased edema, impaired perceived functional ability, impaired flexibility, and pain.  ?  ?ACTIVITY LIMITATIONS cleaning, community activity, driving, meal prep, occupation, laundry, yard work, shopping, and yard work.  ?  ?PERSONAL FACTORS Fitness are also affecting patient's functional outcome.  ?  ?  ?  REHAB POTENTIAL: Good ?  ?CLINICAL DECISION MAKING: Stable/uncomplicated ?  ?EVALUATION COMPLEXITY: Low ?  ?  ?GOALS: ?Goals reviewed with patient? Yes ?  ?SHORT TERM GOALS: Target date: 10/02/2021 ?  ?Patient will be independent in initial HEP to improve functional outcomes. ?  ?Goal status: achieved ?  ?2.  Patient will improve Left knee AAROM flexion in supine to 100 degrees to improve ability to squat, navigate steps ?Baseline: Left knee AAROM flexion 82 degrees in supine ?Goal status: achieved ?  ?3.  Patient will improve Left knee AROM extension to -8 in supine to improve ability to fire quad to decrease fall risk with stance phase of ambulation.  ?Baseline: Left knee AROM extension -12 in supine ?Goal status: achieved  ?  ?  ?LONG TERM GOALS: Target date: 10/16/2021 ?  ?Long term goal: Patient will be independent with advanced HEP and self management strategies  to improve quality of life and functional outcomes. ?  ?  ?Goal status: ongoing ?  ?2.   Patient with improved Left  knee AROM to 0-120 to be able to safely navigate stairs ?  ?Baseline: -12 to 82 ?Goal status: ongoing  ?  ?3.  Long term goal: Patient will increase Left knee MMTs to 5/5 to promote return to ambulation community distances with minimal deviation. ?Baseline: 3-/5 KF/KE ?Goal status: ongoing ?  ?4.  Patient will improve 30 sec STS score from 3 to 6 to demonstrate increased lower extremity strength and improved functional mobilty  ?Baseline: 30 sec STS x 3 ?Goal status: achieved  ?  ?  ?PLAN: ?PT FREQUENCY: 2x/week ?  ?PT DURATION: 4 weeks ?  ?PLANNED INTERVENTIONS: Therapeutic exercises, Therapeutic activity, Neuromuscular re-education, Balance training, Gait training, Patient/Family education, Joint manipulation, Joint mobilization, Stair training, Orthotic/Fit training, DME instructions, Aquatic Therapy, Electrical stimulation, Spinal manipulation, Spinal mobilization, Cryotherapy, Taping, Ultrasound, Biofeedback, Ionotophoresis 4mg /ml Dexamethasone, and Manual therapy ?  ?PLAN FOR NEXT SESSION: progress Left knee mobility and strength as able.  patient scheduled for MRI 10/18/21 ?  ?  ?9:00 AM, 10/09/21 ?Wojciech Willetts Small Trea Latner MPT ?[facility] ?[place] [phone] ?Ph:[phone] ? ?

## 2021-10-14 ENCOUNTER — Ambulatory Visit (HOSPITAL_COMMUNITY): Payer: Medicaid Other

## 2021-10-14 DIAGNOSIS — R262 Difficulty in walking, not elsewhere classified: Secondary | ICD-10-CM

## 2021-10-14 DIAGNOSIS — M25562 Pain in left knee: Secondary | ICD-10-CM

## 2021-10-14 NOTE — Therapy (Signed)
?OUTPATIENT PHYSICAL THERAPY TREATMENT NOTE ? ? ?Patient Name: Kathy Green ?MRN: [id number] ?DOB:Aug 13, 2000, 21 y.o., female ?Today's Date: 10/14/2021 ? ?PCP: Sharilyn Sites, MD ?REFERRING PROVIDER: Earlie Server, MD ? ? PT End of Session - 10/14/21 1259   ? ? Visit Number 5   ? Number of Visits 8   ? Date for PT Re-Evaluation 10/17/21   ? Authorization Type [place] Medicaid Healthy  auth not in at this time   ? Authorization - Visit Number 5   ? Authorization - Number of Visits 8   ? Progress Note Due on Visit 8   ? PT Start Time 1259   ? PT Stop Time 1340   ? PT Time Calculation (min) 41 min   ? ?  ?  ? ?  ? ? ? ? ?Past Medical History:  ?Diagnosis Date  ? ADHD (attention deficit hyperactivity disorder)   ? Anxiety   ? Asthma   ? Depression   ? Irregular bleeding 09/13/2015  ? Ovarian cyst   ? PONV (postoperative nausea and vomiting)   ? Vaginal discharge 09/13/2015  ? Vitamin D deficiency 09/17/2015  ? Yeast infection 09/13/2015  ? ?Past Surgical History:  ?Procedure Laterality Date  ? ADENOIDECTOMY    ? TONSILLECTOMY    ? TOOTH EXTRACTION Bilateral 09/09/2018  ? Procedure: EXTRACTION 3RD MOLARS;  Surgeon: Diona Browner, DDS;  Location: [place];  Service: Oral Surgery;  Laterality: Bilateral;  EXTRACTION 3RD MOLARS  ? ?Patient Active Problem List  ? Diagnosis Date Noted  ? Constipation 01/01/2019  ? Vomiting 12/28/2018  ? Abdominal pain 12/28/2018  ? MDD (major depressive disorder), recurrent episode, severe ([place]) 08/04/2018  ? Vitamin D deficiency 09/17/2015  ? Vaginal discharge 09/13/2015  ? Yeast infection 09/13/2015  ? Irregular bleeding 09/13/2015  ? GROIN PAIN 03/13/2010  ? 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 ? ?REFERRING DIAG: Left knee pain ?  ? ?THERAPY DIAG:  ?Left knee pain ? ?PERTINENT HISTORY: Had some knee pain from Left ankle fracture at 21 years old ?  ? ?PRECAUTIONS: fall ? ?SUBJECTIVE: scheduled for an MRI for Saturday 4/22. Patient continues to complain of significant pain and reports some heat in the knee.   ? ? ?OBJECTIVE: ? ?PAIN:  ?Are you having pain? Yes: NPRS scale: 10/10 ?Pain location: Lt Knee ?Pain description: throbbing  ?Aggravating factors: standing, movement, activity  ?Relieving factors: nothing much helping ? ? ?  ?  ?TODAY'S TREATMENT: ? ?10/14/21 ? ?LE ROM: ?  ?Active ROM Right ?09/18/2021 Left ?09/18/2021 Right ?10/14/21 Left ?10/14/21  ?Hip flexion        ?Hip extension        ?Hip abduction        ?Hip adduction        ?Hip internal rotation        ?Hip external rotation        ?Knee flexion   82 AAROM supine  104 ?supine  ?Knee extension hyperextended -12 supine  -6 ?supine  ?Ankle dorsiflexion        ?Ankle plantarflexion        ?Ankle inversion        ?Ankle eversion        ? (Blank rows = not tested) ?  ?LE MMT: ?  ?MMT Right ?09/18/2021 Left ?09/18/2021 Right ?10/14/21 Left ?10/14/21  ?Hip flexion 5 3+  3+ painful  ?Hip extension        ?Hip abduction        ?Hip adduction        ?  Hip internal rotation        ?Hip external rotation        ?Knee flexion 5 3-  3+ painful  ?Knee extension 5 3-  3+ painful  ?Ankle dorsiflexion 5 4  4   ?Ankle plantarflexion        ?Ankle inversion        ?Ankle eversion        ? ?Bike 6 min; seat 12 ? ?Supine: ?Heel slides x 2' ?Hip ABD x 2' ?Hip add with ball x 2' ?Hip ABD with gait belt x 2' ?SAQs x 2' ?Bridge x 2' ?SLR x 2' ? ?Sitting ?LAQs x 2' ? ?STS 30 sec x 8 today ? ? ? ? ? ?10/09/21 ?Bike 6 min; seat 2 ? ?Supine: ?Heel slides x 2' ?Hip ABD x 2' ?Hip add with ball x 2' ?Hip ABD with gait belt x 2' ?SAQs x 2' ?Bridge x 2' ?SLR x 2' ? ?Sitting ?LAQs x 2' ?Heel/toe raises x 2' ?Hip flexion x 2' ? ?STS 2 x 10 ? ? ? ?       10/01/21 ? Bike full revolutions 5 minutes ? Standing: Heelraises 20X   ?  Knee flexion Lt 15X ?  Hip abduction bil 15X ?  Hip extension bil 15X ?  High march hold 3" 15X ? Supine: bridge 10X ?  SLR 10X ?  Heelslides 10X ?AROM 0-115 Lt LE ?         ?  ?PATIENT EDUCATION:  ?10/14/21 ?Education details: plan for discharge next visit; review of HEP ?Person  educated: Patient ?Education method: Explanation ?Education comprehension: verbalized understanding  ?  ?HOME EXERCISE PROGRAM: ?  ?3/27:  Standing heel raises ?                           Mini squat ?            Sitting     LAQ  ?            Supine:   SAQ x 10  ?                          SLR  x 10  ?At evalution: Ankle pumps, quad sets, heel slides in supine and sitting ?                            ?ASSESSMENT: ?  ?CLINICAL IMPRESSION: ?Patient continues to report high pain levels 10/10.  She is still wearing immobilizer but walking without AD.  She has made good improvements with both Left knee flexion and extension ROM.  She is able to make full revolutions on the bike. Improved strength noted with MMT's but patient continues with complaint of increased pain with testing.   Patient will benefit from continued skilled therapy interventions to address deficits and improve functional mobility.  ? ? ?OBJECTIVE IMPAIRMENTS Abnormal gait, decreased activity tolerance, decreased balance, decreased endurance, decreased mobility, difficulty walking, decreased ROM, decreased strength, increased edema, impaired perceived functional ability, impaired flexibility, and pain.  ?  ?ACTIVITY LIMITATIONS cleaning, community activity, driving, meal prep, occupation, laundry, yard work, shopping, and yard work.  ?  ?PERSONAL FACTORS Fitness are also affecting patient's functional outcome.  ?  ?  ?REHAB POTENTIAL: Good ?  ?CLINICAL DECISION MAKING: Stable/uncomplicated ?  ?EVALUATION COMPLEXITY: Low ?  ?  ?GOALS: ?Goals  reviewed with patient? Yes ?  ?SHORT TERM GOALS: Target date: 10/02/2021 ?  ?Patient will be independent in initial HEP to improve functional outcomes. ?  ?Goal status: met  ?2.  Patient will improve Left knee AAROM flexion in supine to 100 degrees to improve ability to squat, navigate steps ?Baseline: Left knee AAROM flexion 104 degrees in supine ?Goal status: met ?  ?3.  Patient will improve Left knee AROM extension to  -8 in supine to improve ability to fire quad to decrease fall risk with stance phase of ambulation.  ?Baseline: Left knee AROM extension -6 in supine ?Goal status: met ?  ?  ?LONG TERM GOALS: Target date: 10/16/2021 ?  ?Long term goal: Patient will be independent with advanced HEP and self management strategies to improve quality of life and functional outcomes. ?  ?  ?Goal status: ongoing ?  ?2.   Patient with improved Left  knee AROM to 0-120 to be able to safely navigate stairs ?  ?Baseline: -6 to 104 ?Goal status: ongoing  ?  ?3.  Long term goal: Patient will increase Left knee MMTs to 5/5 to promote return to ambulation community distances with minimal deviation. ?Baseline: 3+/5 KF/KE ?Goal status: ongoing ?  ?4.  Patient will improve 30 sec STS score from 3 to 6 to demonstrate increased lower extremity strength and improved functional mobilty  ?Baseline: 30 sec STS x 3 ?Goal status: achieved  ?  ?  ?PLAN: ?PT FREQUENCY: 2x/week ?  ?PT DURATION: 4 weeks ?  ?PLANNED INTERVENTIONS: Therapeutic exercises, Therapeutic activity, Neuromuscular re-education, Balance training, Gait training, Patient/Family education, Joint manipulation, Joint mobilization, Stair training, Orthotic/Fit training, DME instructions, Aquatic Therapy, Electrical stimulation, Spinal manipulation, Spinal mobilization, Cryotherapy, Taping, Ultrasound, Biofeedback, Ionotophoresis 82m/ml Dexamethasone, and Manual therapy ?  ?PLAN FOR NEXT SESSION: progress Left knee mobility and strength as able.  patient scheduled for MRI 10/18/21 will likely d/c after next visit as patient's pain level is staying 9/10 to 10/10 with therapy.  ?  ?  ?1:41 PM, 10/14/21 ?Kamilya Wakeman Small Wally Shevchenko MPT ?[facility] ?[place] #[phone]?Ph:[phone] ? ?

## 2021-10-16 ENCOUNTER — Ambulatory Visit (HOSPITAL_COMMUNITY): Payer: Medicaid Other | Admitting: Physical Therapy

## 2021-10-16 DIAGNOSIS — M25562 Pain in left knee: Secondary | ICD-10-CM

## 2021-10-16 DIAGNOSIS — R262 Difficulty in walking, not elsewhere classified: Secondary | ICD-10-CM

## 2021-10-16 NOTE — Therapy (Addendum)
?OUTPATIENT PHYSICAL THERAPY TREATMENT NOTE ? ?PHYSICAL THERAPY DISCHARGE SUMMARY ? ?Visits from Start of Care: 6 ? ?Current functional level related to goals / functional outcomes: ?See below ?  ?Remaining deficits: ?See below ?  ?Education / Equipment: ?HEP  ? ?Patient agrees to discharge. Patient goals were partially met. Patient is being discharged due to lack of progress.Patient's pain level remains high.  MRI scheduled for Sat. 10/18/21 ? ? ? ?Patient Name: Kathy Green ?MRN: [id number] ?DOB:July 26, 2000, 21 y.o., female ?Today's Date: 10/16/2021 ? ?PCP: Sharilyn Sites, MD ?REFERRING PROVIDER: Earlie Server, MD ? ? PT End of Session - 10/16/21 1354   ? ? Visit Number 6   ? Number of Visits 8   ? Date for PT Re-Evaluation 10/17/21   ? Authorization Type [place] Medicaid Healthy  auth not in at this time   ? Authorization - Visit Number 6   ? Authorization - Number of Visits 8   ? Progress Note Due on Visit 8   ? PT Start Time 1318   ? PT Stop Time 5102   ? PT Time Calculation (min) 27 min   ? ?  ?  ? ?  ? ? ? ? ? ?Past Medical History:  ?Diagnosis Date  ? ADHD (attention deficit hyperactivity disorder)   ? Anxiety   ? Asthma   ? Depression   ? Irregular bleeding 09/13/2015  ? Ovarian cyst   ? PONV (postoperative nausea and vomiting)   ? Vaginal discharge 09/13/2015  ? Vitamin D deficiency 09/17/2015  ? Yeast infection 09/13/2015  ? ?Past Surgical History:  ?Procedure Laterality Date  ? ADENOIDECTOMY    ? TONSILLECTOMY    ? TOOTH EXTRACTION Bilateral 09/09/2018  ? Procedure: EXTRACTION 3RD MOLARS;  Surgeon: Diona Browner, DDS;  Location: [facility];  Service: Oral Surgery;  Laterality: Bilateral;  EXTRACTION 3RD MOLARS  ? ?Patient Active Problem List  ? Diagnosis Date Noted  ? Constipation 01/01/2019  ? Vomiting 12/28/2018  ? Abdominal pain 12/28/2018  ? MDD (major depressive disorder), recurrent episode, severe ([place]) 08/04/2018  ? Vitamin D deficiency 09/17/2015  ? Vaginal discharge 09/13/2015  ? Yeast infection 09/13/2015  ?  Irregular bleeding 09/13/2015  ? GROIN PAIN 03/13/2010  ? 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 ? ?REFERRING DIAG: Left knee pain ?  ? ?THERAPY DIAG:  ?Left knee pain ? ?PERTINENT HISTORY: Had some knee pain from Left ankle fracture at 21 years old ?  ? ?PRECAUTIONS: fall ? ?SUBJECTIVE: Pt states her pain is still high at 9/10.  States she's been doing the exercises and she's been walking around [place].  States its painful but she's been pushing herself. scheduled for an MRI for Saturday 4/22. Patient continues to complain of significant pain and reports some heat in the knee. Visible swelling into Lt ankle as well. ? ? ?OBJECTIVE: ? ?PAIN:  ?Are you having pain? Yes: NPRS scale: 10/10 ?Pain location: Lt Knee ?Pain description: throbbing  ?Aggravating factors: standing, movement, activity  ?Relieving factors: nothing much helping ? ? ?  ?  ?TODAY'S TREATMENT: ?10/16/21 ?Review of HEP in entirety ?Education for discharge ? ? ?10/14/21 ? ?LE ROM: ?  ?Active ROM Right ?09/18/2021 Left ?09/18/2021 Left ?10/14/21 Left ?10/16/21  ?Hip flexion        ?Hip extension        ?Hip abduction        ?Hip adduction        ?Hip internal rotation        ?  Hip external rotation        ?Knee flexion   82 AAROM supine 104 ?supine 117 ?supine  ?Knee extension hyperextended -12 supine -6 ?supine 0 ?supine  ?Ankle dorsiflexion        ?Ankle plantarflexion        ?Ankle inversion        ?Ankle eversion        ? (Blank rows = not tested) ?  ?LE MMT: ?  ?MMT Right ?09/18/2021 Left ?09/18/2021 Left ?10/14/21  ?Hip flexion 5 3+ 3+ painful  ?Hip extension       ?Hip abduction       ?Hip adduction       ?Hip internal rotation       ?Hip external rotation       ?Knee flexion 5 3- 3+ painful  ?Knee extension 5 3- 3+ painful  ?Ankle dorsiflexion 5 4 4   ?Ankle plantarflexion       ?Ankle inversion       ?Ankle eversion       ? ?Bike 6 min; seat 12 ?Supine: ?Heel slides x 2' ?Hip ABD x 2' ?Hip add with ball x 2' ?Hip ABD with gait belt x 2' ?SAQs x  2' ?Bridge x 2' ?SLR x 2' ?Sitting ?LAQs x 2' ?STS 30 sec x 8 today ? ? ?10/09/21 ?Bike 6 min; seat 2 ?Supine: ?Heel slides x 2' ?Hip ABD x 2' ?Hip add with ball x 2' ?Hip ABD with gait belt x 2' ?SAQs x 2' ?Bridge x 2' ?SLR x 2' ?Sitting ?LAQs x 2' ?Heel/toe raises x 2' ?Hip flexion x 2' ?STS 2 x 10 ? ?10/01/21 ?Bike full revolutions 5 minutes ?Standing: Heelraises 20X   ? Knee flexion Lt 15X ? Hip abduction bil 15X ? Hip extension bil 15X ? High march hold 3" 15X ?Supine: bridge 10X ? SLR 10X ? Heelslides 10X ?AROM 0-115 Lt LE ?         ?  ?PATIENT EDUCATION:  ?10/14/21 ?Education details: plan for discharge next visit; review of HEP ?Person educated: Patient ?Education method: Explanation ?Education comprehension: verbalized understanding  ?  ?HOME EXERCISE PROGRAM: ?  ?3/27:  Standing heel raises ?                           Mini squat ?            Sitting     LAQ  ?            Supine:   SAQ x 10  ?                          SLR  x 10  ?At evalution: Ankle pumps, quad sets, heel slides in supine and sitting ?                            ?ASSESSMENT: ?  ?CLINICAL IMPRESSION: ?Patient continues to report high pain levels between 9-10/10.  She has met all STG's but only 2/4 LTG's due to pain and immobility.  Pt is independent with her HEP and is walking for exericse as well.  Pt has overall made good improvements with both Left knee flexion and extension ROM and able to achieve AROM of 0-117 today.  Improved strength noted with MMT's but patient continues with complaint of increased pain with testing.  Patient is agreeable to discharge at this time as pain is her limiting factor and will be having an MRI this Saturday to determine underlying cause.  ? ? ?OBJECTIVE IMPAIRMENTS Abnormal gait, decreased activity tolerance, decreased balance, decreased endurance, decreased mobility, difficulty walking, decreased ROM, decreased strength, increased edema, impaired perceived functional ability, impaired flexibility, and pain.   ?  ?ACTIVITY LIMITATIONS cleaning, community activity, driving, meal prep, occupation, laundry, yard work, shopping, and yard work.  ?  ?PERSONAL FACTORS Fitness are also affecting patient's functional outcome.  ?  ?  ?REHAB POTENTIAL: Good ?  ?CLINICAL DECISION MAKING: Stable/uncomplicated ?  ?  ?GOALS: ?Goals reviewed with patient? Yes ?  ?SHORT TERM GOALS: Target date: 10/02/2021 ?  ?Patient will be independent in initial HEP to improve functional outcomes. ?  ?Goal status: met  ?2.  Patient will improve Left knee AAROM flexion in supine to 100 degrees to improve ability to squat, navigate steps ?Baseline: Left knee AAROM flexion 104 degrees in supine ?Goal status: met ?  ?3.  Patient will improve Left knee AROM extension to -8 in supine to improve ability to fire quad to decrease fall risk with stance phase of ambulation.  ?Baseline: Left knee AROM extension -6 in supine ?Goal status: met ?  ?  ?LONG TERM GOALS: Target date: 10/16/2021 ?  ?Long term goal: Patient will be independent with advanced HEP and self management strategies to improve quality of life and functional outcomes. ?   Goal status: Achieved ?  ?2.   Patient with improved Left  knee AROM to 0-120 to be able to safely navigate stairs ?  Baseline: -6 to 104 ?Goal status: Partly Met AROM 1-117 today ?  ?3.  Long term goal: Patient will increase Left knee MMTs to 5/5 to promote return to ambulation community distances with minimal deviation. ?Baseline: 3+/5 KF/KE ?Goal status: Not met due to pain ?  ?4.  Patient will improve 30 sec STS score from 3 to 6 to demonstrate increased lower extremity strength and improved functional mobilty  ?Baseline: 30 sec STS x 3 ?Goal status: Achieved  ?  ?  ?PLAN: ?PT FREQUENCY: 2x/week ?  ?PT DURATION: 4 weeks ?  ?PLANNED INTERVENTIONS: Therapeutic exercises, Therapeutic activity, Neuromuscular re-education, Balance training, Gait training, Patient/Family education, Joint manipulation, Joint mobilization, Stair  training, Orthotic/Fit training, DME instructions, Aquatic Therapy, Electrical stimulation, Spinal manipulation, Spinal mobilization, Cryotherapy, Taping, Ultrasound, Biofeedback, Ionotophoresis 64m/ml Dexame

## 2021-11-04 NOTE — Telephone Encounter (Signed)
Pt stated she has to take her mother to the cardiologist  ?

## 2021-11-04 NOTE — Telephone Encounter (Signed)
Pt stated she got called into work and will not be able to make the appointment. Appt canceled per pt  ?

## 2021-11-05 ENCOUNTER — Encounter: Payer: Self-pay | Admitting: Emergency Medicine

## 2021-11-05 ENCOUNTER — Ambulatory Visit
Admission: EM | Admit: 2021-11-05 | Discharge: 2021-11-05 | Disposition: A | Discharge status: Home / Self Care | Payer: Medicaid Other | Attending: Family Medicine | Admitting: Family Medicine

## 2021-11-05 DIAGNOSIS — J4521 Mild intermittent asthma with (acute) exacerbation: Secondary | ICD-10-CM

## 2021-11-05 DIAGNOSIS — J069 Acute upper respiratory infection, unspecified: Secondary | ICD-10-CM

## 2021-11-05 MED ORDER — PREDNISONE 20 MG PO TABS: 40.0000 mg | ORAL_TABLET | Freq: Every day | ORAL | 0 refills | Status: AC

## 2021-11-05 MED ORDER — PROMETHAZINE-DM 6.25-15 MG/5ML PO SYRP
5.0000 mL | ORAL_SOLUTION | Freq: Four times a day (QID) | ORAL | 0 refills | Status: AC | PRN
Start: 2021-11-05 — End: ?

## 2021-11-05 NOTE — ED Triage Notes (Signed)
Nasal congestion, bilateral ear pain, sore throat, and cough since Sunday.  States chest feels tight and has been having to use her inhaler. ?

## 2021-11-05 NOTE — ED Provider Notes (Signed)
?[facility] ? ? ? ?CSN: [id number] ?Arrival date & time: 11/05/21  1737 ? ? ?  ? ?History   ?Chief Complaint ?No chief complaint on file. ? ? ?HPI ?Kathy Green is a 21 y.o. female.  ? ?Presenting today with 4-day history of nasal congestion, bilateral ear pain and pressure, sore throat, cough, wheezing, chest tightness.  Denies fever, chills, body aches, chest pain, shortness of breath, abdominal pain, nausea vomiting or diarrhea.  Using her albuterol inhaler and Benadryl with minimal relief.  No known sick contacts recently.  History of asthma. ? ? ?Past Medical History:  ?Diagnosis Date  ? ADHD (attention deficit hyperactivity disorder)   ? Anxiety   ? Asthma   ? Depression   ? Irregular bleeding 09/13/2015  ? Ovarian cyst   ? PONV (postoperative nausea and vomiting)   ? Vaginal discharge 09/13/2015  ? Vitamin D deficiency 09/17/2015  ? Yeast infection 09/13/2015  ? ? ?Patient Active Problem List  ? Diagnosis Date Noted  ? Constipation 01/01/2019  ? Vomiting 12/28/2018  ? Abdominal pain 12/28/2018  ? MDD (major depressive disorder), recurrent episode, severe (HCC) 08/04/2018  ? Vitamin D deficiency 09/17/2015  ? Vaginal discharge 09/13/2015  ? Yeast infection 09/13/2015  ? Irregular bleeding 09/13/2015  ? GROIN PAIN 03/13/2010  ? 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 ? ?Past Surgical History:  ?Procedure Laterality Date  ? ADENOIDECTOMY    ? TONSILLECTOMY    ? TOOTH EXTRACTION Bilateral 09/09/2018  ? Procedure: EXTRACTION 3RD MOLARS;  Surgeon: Ocie Doyne, DDS;  Location: [facility] OR;  Service: Oral Surgery;  Laterality: Bilateral;  EXTRACTION 3RD MOLARS  ? ? ?OB History   ? ? Gravida  ?0  ? Para  ?0  ? Term  ?0  ? Preterm  ?0  ? AB  ?0  ? Living  ?0  ?  ? ? SAB  ?0  ? IAB  ?0  ? Ectopic  ?0  ? Multiple  ?0  ? Live Births  ?   ?   ?  ?  ? ? ? ?Home Medications   ? ?Prior to Admission medications   ?Medication Sig Start Date End Date Taking? Authorizing Provider  ?predniSONE (DELTASONE) 20 MG tablet Take 2 tablets (40 mg  total) by mouth daily with breakfast. 11/05/21  Yes Particia Nearing, PA-C  ?promethazine-dextromethorphan (PROMETHAZINE-DM) 6.25-15 MG/5ML syrup Take 5 mLs by mouth 4 (four) times daily as needed. 11/05/21  Yes Particia Nearing, PA-C  ?acetaminophen (TYLENOL) 325 MG tablet Take 2 tablets (650 mg total) by mouth every 6 (six) hours as needed for mild pain or moderate pain. 12/29/18   Reasor, [place], MD  ?albuterol (VENTOLIN HFA) 108 (90 Base) MCG/ACT inhaler Inhale 2 puffs into the lungs every 6 (six) hours as needed for wheezing or shortness of breath.     [provider]  ?BLISOVI FE 1/20 1-20 MG-MCG tablet Take 1 tablet by mouth daily. 07/30/21   [provider]  ?diphenhydrAMINE (BENADRYL) 25 MG tablet Take 1 tablet (25 mg total) by mouth every 6 (six) hours as needed for itching. ?Patient not taking: Reported on 08/06/2021 11/15/17   Loren Racer, MD  ?docusate sodium (COLACE) 100 MG capsule Take 1 capsule (100 mg total) by mouth 2 (two) times daily. ?Patient not taking: Reported on 08/06/2021 12/30/18   Reasor, [place], MD  ?ibuprofen (ADVIL) 800 MG tablet Take 1 tablet (800 mg total) by mouth every 8 (eight) hours as needed. ?Patient taking differently:  Take 800 mg by mouth every 8 (eight) hours as needed for fever or mild pain. 01/23/19   Bethann Berkshire, MD  ?metroNIDAZOLE (FLAGYL) 500 MG tablet Take 1 tablet (500 mg total) by mouth 2 (two) times daily. 08/06/21   Bethann Berkshire, MD  ?naproxen (NAPROSYN) 500 MG tablet Take 1 tablet (500 mg total) by mouth 2 (two) times daily with a meal. ?Patient not taking: Reported on 08/06/2021 05/09/19   Eber Hong, MD  ?pantoprazole (PROTONIX) 40 MG tablet Take 1 tablet (40 mg total) by mouth daily. 01/02/19 02/01/19  Alexander Mt, MD  ? ? ?Family History ?Family History  ?Problem Relation Age of Onset  ? Hodgkin's lymphoma Mother   ?     in remission  ? Depression Mother   ? Anxiety disorder Mother   ? Hypertension Mother   ? Bipolar disorder  Mother   ? Arthritis Mother   ? Hypertension Father   ? Depression Father   ? Heart attack Father   ? ADD / ADHD Brother   ? Learning disabilities Brother   ? Other Maternal Grandmother   ?     degenerative disc disease  ? Hypertension Maternal Grandmother   ? Arthritis Maternal Grandmother   ? Cancer Maternal Grandmother   ?     skin  ? Heart disease Maternal Grandfather   ? Diabetes Maternal Grandfather   ? Hypertension Maternal Grandfather   ? Depression Paternal Grandmother   ? Hypertension Paternal Grandmother   ? Diabetes Paternal Grandmother   ? Thyroid cancer Paternal Grandmother   ? Kidney disease Paternal Grandmother   ? Asthma Paternal Grandmother   ? COPD Paternal Grandfather   ? Other Other   ?     renal failure  ? Cancer Other   ?     ovarian  ? ? ?Social History ?Social History  ? ?Tobacco Use  ? Smoking status: Never  ? Smokeless tobacco: Never  ?Vaping Use  ? Vaping Use: Every day  ? Substances: Nicotine  ?Substance Use Topics  ? Alcohol use: No  ? Drug use: No  ? ? ? ?Allergies   ?Amoxicillin-pot clavulanate and Penicillins ? ? ?Review of Systems ?Review of Systems ?Per HPI ? ?Physical Exam ?Triage Vital Signs ?ED Triage Vitals  ?Enc Vitals Group  ?   BP 11/05/21 1752 124/82  ?   Pulse Rate 11/05/21 1752 82  ?   Resp 11/05/21 1752 18  ?   Temp 11/05/21 1752 98.7 ?F (37.1 ?C)  ?   Temp Source 11/05/21 1752 Oral  ?   SpO2 11/05/21 1752 96 %  ?   Weight --   ?   Height --   ?   Head Circumference --   ?   Peak Flow --   ?   Pain Score 11/05/21 1753 8  ?   Pain Loc --   ?   Pain Edu? --   ?   Excl. in GC? --   ? ?No data found. ? ?Updated Vital Signs ?BP 124/82 (BP Location: Right Arm)   Pulse 82   Temp 98.7 ?F (37.1 ?C) (Oral)   Resp 18   LMP 10/28/2021 (Exact Date)   SpO2 96%  ? ?Visual Acuity ?Right Eye Distance:   ?Left Eye Distance:   ?Bilateral Distance:   ? ?Right Eye Near:   ?Left Eye Near:    ?Bilateral Near:    ? ?Physical Exam ?Vitals and nursing note reviewed.  ?Constitutional:   ?  Appearance: Normal appearance.  ?HENT:  ?   Head: Atraumatic.  ?   Right Ear: Tympanic membrane and external ear normal.  ?   Left Ear: Tympanic membrane and external ear normal.  ?   Nose: Rhinorrhea present.  ?   Mouth/Throat:  ?   Mouth: Mucous membranes are moist.  ?   Pharynx: Posterior oropharyngeal erythema present.  ?Eyes:  ?   Extraocular Movements: Extraocular movements intact.  ?   Conjunctiva/sclera: Conjunctivae normal.  ?Cardiovascular:  ?   Rate and Rhythm: Normal rate and regular rhythm.  ?   Heart sounds: Normal heart sounds.  ?Pulmonary:  ?   Effort: Pulmonary effort is normal.  ?   Breath sounds: Wheezing present. No rales.  ?Musculoskeletal:     ?   General: Normal range of motion.  ?   Cervical back: Normal range of motion and neck supple.  ?Skin: ?   General: Skin is warm and dry.  ?Neurological:  ?   Mental Status: She is alert and oriented to person, place, and time.  ?Psychiatric:     ?   Mood and Affect: Mood normal.     ?   Thought Content: Thought content normal.  ? ? ? ?UC Treatments / Results  ?Labs ?(all labs ordered are listed, but only abnormal results are displayed) ?Labs Reviewed  ?COVID-19, FLU A+B NAA  ? ? ?EKG ? ? ?Radiology ?No results found. ? ?Procedures ?Procedures (including critical care time) ? ?Medications Ordered in UC ?Medications - No data to display ? ?Initial Impression / Assessment and Plan / UC Course  ?I have reviewed the triage vital signs and the nursing notes. ? ?Pertinent labs & imaging results that were available during my care of the patient were reviewed by me and considered in my medical decision making (see chart for details). ? ?  ? ?Vital signs benign and reassuring, suspect viral upper respiratory infection causing an asthma exacerbation.  Treat with prednisone, Phenergan DM, supportive over-the-counter medications and home care, continued albuterol as needed.  COVID and flu pending, work note given.  Return for any worsening symptoms. ? ?Final  Clinical Impressions(s) / UC Diagnoses  ? ?Final diagnoses:  ?Viral URI with cough  ?Mild intermittent asthma with acute exacerbation  ? ?Discharge Instructions   ?None ?  ? ?ED Prescriptions   ? ? Medication Sig D

## 2021-11-06 LAB — COVID-19, FLU A+B NAA
Influenza A, NAA: NOT DETECTED
Influenza B, NAA: NOT DETECTED
SARS-CoV-2, NAA: NOT DETECTED

## 2021-11-24 ENCOUNTER — Emergency Department (HOSPITAL_COMMUNITY)
Admission: EM | Admit: 2021-11-24 | Discharge: 2021-11-24 | Disposition: A | Discharge status: Home / Self Care | Payer: Medicaid Other | Attending: Emergency Medicine | Admitting: Emergency Medicine

## 2021-11-24 ENCOUNTER — Encounter (HOSPITAL_COMMUNITY): Payer: Self-pay

## 2021-11-24 ENCOUNTER — Other Ambulatory Visit: Payer: Self-pay

## 2021-11-24 DIAGNOSIS — R519 Headache, unspecified: Secondary | ICD-10-CM | POA: Insufficient documentation

## 2021-11-24 DIAGNOSIS — G43909 Migraine, unspecified, not intractable, without status migrainosus: Secondary | ICD-10-CM | POA: Diagnosis not present

## 2021-11-24 LAB — POC URINE PREG, ED: Preg Test, Ur: NEGATIVE

## 2021-11-24 MED ORDER — SODIUM CHLORIDE 0.9 % IV BOLUS
1000.0000 mL | Freq: Once | INTRAVENOUS | Status: AC
  Administered 2021-11-24: 1000 mL via INTRAVENOUS

## 2021-11-24 MED ORDER — DIPHENHYDRAMINE HCL 50 MG/ML IJ SOLN
25.0000 mg | Freq: Once | INTRAMUSCULAR | Status: AC
  Administered 2021-11-24: 25 mg via INTRAVENOUS
  Filled 2021-11-24: qty 1

## 2021-11-24 MED ORDER — PROCHLORPERAZINE EDISYLATE 10 MG/2ML IJ SOLN
10.0000 mg | Freq: Once | INTRAMUSCULAR | Status: AC
  Administered 2021-11-24: 10 mg via INTRAVENOUS
  Filled 2021-11-24: qty 2

## 2021-11-24 MED ORDER — KETOROLAC TROMETHAMINE 30 MG/ML IJ SOLN
15.0000 mg | Freq: Once | INTRAMUSCULAR | Status: AC
  Administered 2021-11-24: 15 mg via INTRAVENOUS
  Filled 2021-11-24: qty 1

## 2021-11-24 MED ORDER — DEXAMETHASONE SODIUM PHOSPHATE 10 MG/ML IJ SOLN
10.0000 mg | Freq: Once | INTRAMUSCULAR | Status: AC
Start: 2021-11-24 — End: 2021-11-24
  Administered 2021-11-24: 10 mg via INTRAVENOUS
  Filled 2021-11-24: qty 1

## 2021-11-24 NOTE — ED Provider Notes (Signed)
[facility] EMERGENCY DEPARTMENT Provider Note   CSN: [id number] Arrival date & time: 11/24/21  0049     History  Chief Complaint  Patient presents with   Migraine    Kathy Green is a 21 y.o. female.  Patient presents to the emergency department for evaluation of headache.  Patient reports that she has had several days of pounding headache with nausea, light sensitivity.  She has been experiencing vomiting with a headache.  This is happened in the past, she has had migraines but she has not had one in a while.      Home Medications Prior to Admission medications   Medication Sig Start Date End Date Taking? Authorizing Provider  acetaminophen (TYLENOL) 325 MG tablet Take 2 tablets (650 mg total) by mouth every 6 (six) hours as needed for mild pain or moderate pain. 12/29/18   Reasor, [place], MD  albuterol (VENTOLIN HFA) 108 (90 Base) MCG/ACT inhaler Inhale 2 puffs into the lungs every 6 (six) hours as needed for wheezing or shortness of breath.     [provider]  BLISOVI FE 1/20 1-20 MG-MCG tablet Take 1 tablet by mouth daily. 07/30/21   [provider]  diphenhydrAMINE (BENADRYL) 25 MG tablet Take 1 tablet (25 mg total) by mouth every 6 (six) hours as needed for itching. Patient not taking: Reported on 08/06/2021 11/15/17   Loren Racer, MD  docusate sodium (COLACE) 100 MG capsule Take 1 capsule (100 mg total) by mouth 2 (two) times daily. Patient not taking: Reported on 08/06/2021 12/30/18   Reasor, [place], MD  ibuprofen (ADVIL) 800 MG tablet Take 1 tablet (800 mg total) by mouth every 8 (eight) hours as needed. Patient taking differently: Take 800 mg by mouth every 8 (eight) hours as needed for fever or mild pain. 01/23/19   Bethann Berkshire, MD  metroNIDAZOLE (FLAGYL) 500 MG tablet Take 1 tablet (500 mg total) by mouth 2 (two) times daily. 08/06/21   Bethann Berkshire, MD  naproxen (NAPROSYN) 500 MG tablet Take 1 tablet (500 mg total) by mouth 2 (two) times daily with  a meal. Patient not taking: Reported on 08/06/2021 05/09/19   Eber Hong, MD  pantoprazole (PROTONIX) 40 MG tablet Take 1 tablet (40 mg total) by mouth daily. 01/02/19 02/01/19  Alexander Mt, MD  predniSONE (DELTASONE) 20 MG tablet Take 2 tablets (40 mg total) by mouth daily with breakfast. 11/05/21   Particia Nearing, PA-C  promethazine-dextromethorphan (PROMETHAZINE-DM) 6.25-15 MG/5ML syrup Take 5 mLs by mouth 4 (four) times daily as needed. 11/05/21   Particia Nearing, PA-C      Allergies    Amoxicillin-pot clavulanate and Penicillins    Review of Systems   Review of Systems  Physical Exam Updated Vital Signs BP (!) 145/88   Pulse 87   Temp 98.4 F (36.9 C) (Oral)   Resp 14   Ht 5\' 7"  (1.702 m)   Wt 127 kg   LMP 11/17/2021 (Exact Date)   SpO2 97%   BMI 43.85 kg/m  Physical Exam Vitals and nursing note reviewed.  Constitutional:      General: She is not in acute distress.    Appearance: She is well-developed.  HENT:     Head: Normocephalic and atraumatic.     Mouth/Throat:     Mouth: Mucous membranes are moist.  Eyes:     General: Vision grossly intact. Gaze aligned appropriately.     Extraocular Movements: Extraocular movements intact.     Conjunctiva/sclera:  Conjunctivae normal.  Cardiovascular:     Rate and Rhythm: Normal rate and regular rhythm.     Pulses: Normal pulses.     Heart sounds: Normal heart sounds, S1 normal and S2 normal. No murmur heard.   No friction rub. No gallop.  Pulmonary:     Effort: Pulmonary effort is normal. No respiratory distress.     Breath sounds: Normal breath sounds.  Abdominal:     General: Bowel sounds are normal.     Palpations: Abdomen is soft.     Tenderness: There is no abdominal tenderness. There is no guarding or rebound.     Hernia: No hernia is present.  Musculoskeletal:        General: No swelling.     Cervical back: Full passive range of motion without pain, normal range of motion and neck supple. No  spinous process tenderness or muscular tenderness. Normal range of motion.     Right lower leg: No edema.     Left lower leg: No edema.  Skin:    General: Skin is warm and dry.     Capillary Refill: Capillary refill takes less than 2 seconds.     Findings: No ecchymosis, erythema, rash or wound.  Neurological:     General: No focal deficit present.     Mental Status: She is alert and oriented to person, place, and time.     GCS: GCS eye subscore is 4. GCS verbal subscore is 5. GCS motor subscore is 6.     Cranial Nerves: Cranial nerves 2-12 are intact.     Sensory: Sensation is intact.     Motor: Motor function is intact.     Coordination: Coordination is intact.  Psychiatric:        Attention and Perception: Attention normal.        Mood and Affect: Mood normal.        Speech: Speech normal.        Behavior: Behavior normal.    ED Results / Procedures / Treatments   Labs (all labs ordered are listed, but only abnormal results are displayed) Labs Reviewed  POC URINE PREG, ED    EKG None  Radiology No results found.  Procedures Procedures    Medications Ordered in ED Medications  sodium chloride 0.9 % bolus 1,000 mL (1,000 mLs Intravenous New Bag/Given 11/24/21 0155)  ketorolac (TORADOL) 30 MG/ML injection 15 mg (15 mg Intravenous Given 11/24/21 0155)  prochlorperazine (COMPAZINE) injection 10 mg (10 mg Intravenous Given 11/24/21 0155)  diphenhydrAMINE (BENADRYL) injection 25 mg (25 mg Intravenous Given 11/24/21 0155)  dexamethasone (DECADRON) injection 10 mg (10 mg Intravenous Given 11/24/21 0156)    ED Course/ Medical Decision Making/ A&P                           Medical Decision Making Risk Prescription drug management.   Patient presents to the emergency department for evaluation of migraine headache. Patient is currently experiencing a headache that is similar to previous migraines. Patient does not have any unusual features compared to previous migraines.  Patient has normal neurologic examination. There are no unusual features, such as unusual intensity or sudden onset. As this headache is similar to previous migraines, there is no concern for subarachnoid hemorrhage or other etiology. Patient therefore does not require imaging. Patient treated as migraine headache.        Final Clinical Impression(s) / ED Diagnoses Final diagnoses:  Bad headache  Rx / DC Orders ED Discharge Orders     None         Kobe Ofallon, Canary Brimhristopher J, MD 11/24/21 [phone]

## 2021-11-24 NOTE — ED Triage Notes (Signed)
Migraine x4 days. Emesis x3 since today. Hx of the same.

## 2022-01-07 ENCOUNTER — Other Ambulatory Visit: Payer: Self-pay

## 2022-01-07 ENCOUNTER — Encounter (HOSPITAL_COMMUNITY): Payer: Self-pay | Admitting: Emergency Medicine

## 2022-01-07 ENCOUNTER — Emergency Department (HOSPITAL_COMMUNITY)
Admission: EM | Admit: 2022-01-07 | Discharge: 2022-01-07 | Disposition: A | Discharge status: Home / Self Care | Payer: Medicaid Other | Attending: Emergency Medicine | Admitting: Emergency Medicine

## 2022-01-07 DIAGNOSIS — N939 Abnormal uterine and vaginal bleeding, unspecified: Secondary | ICD-10-CM | POA: Insufficient documentation

## 2022-01-07 LAB — CBC WITH DIFFERENTIAL/PLATELET
Abs Immature Granulocytes: 0.02 10*3/uL (ref 0.00–0.07)
Basophils Absolute: 0 10*3/uL (ref 0.0–0.1)
Basophils Relative: 0 %
Eosinophils Absolute: 0.1 10*3/uL (ref 0.0–0.5)
Eosinophils Relative: 1 %
HCT: 40.2 % (ref 36.0–46.0)
Hemoglobin: 13 g/dL (ref 12.0–15.0)
Immature Granulocytes: 0 %
Lymphocytes Relative: 28 %
Lymphs Abs: 3 10*3/uL (ref 0.7–4.0)
MCH: 28.1 pg (ref 26.0–34.0)
MCHC: 32.3 g/dL (ref 30.0–36.0)
MCV: 87 fL (ref 80.0–100.0)
Monocytes Absolute: 0.9 10*3/uL (ref 0.1–1.0)
Monocytes Relative: 8 %
Neutro Abs: 6.9 10*3/uL (ref 1.7–7.7)
Neutrophils Relative %: 63 %
Platelets: 264 10*3/uL (ref 150–400)
RBC: 4.62 MIL/uL (ref 3.87–5.11)
RDW: 13.3 % (ref 11.5–15.5)
WBC: 10.9 10*3/uL — ABNORMAL HIGH (ref 4.0–10.5)
nRBC: 0 % (ref 0.0–0.2)

## 2022-01-07 LAB — BASIC METABOLIC PANEL
Anion gap: 5 (ref 5–15)
BUN: 16 mg/dL (ref 6–20)
CO2: 30 mmol/L (ref 22–32)
Calcium: 9 mg/dL (ref 8.9–10.3)
Chloride: 104 mmol/L (ref 98–111)
Creatinine, Ser: 0.75 mg/dL (ref 0.44–1.00)
GFR, Estimated: >60 mL/min (ref >60)
Glucose, Bld: 105 mg/dL — ABNORMAL HIGH (ref 70–99)
Potassium: 4 mmol/L (ref 3.5–5.1)
Sodium: 139 mmol/L (ref 135–145)

## 2022-01-07 LAB — POC URINE PREG, ED: Preg Test, Ur: NEGATIVE

## 2022-01-07 LAB — TYPE AND SCREEN
ABO/RH(D): O POS
Antibody Screen: NEGATIVE

## 2022-01-07 MED ORDER — LACTATED RINGERS IV BOLUS
1000.0000 mL | Freq: Once | INTRAVENOUS | Status: AC
  Administered 2022-01-07: 1000 mL via INTRAVENOUS

## 2022-01-07 MED ORDER — MEGESTROL ACETATE 40 MG PO TABS
160.0000 mg | ORAL_TABLET | Freq: Every day | ORAL | Status: DC
  Administered 2022-01-07: 160 mg via ORAL
  Filled 2022-01-07: qty 4

## 2022-01-07 MED ORDER — MEDROXYPROGESTERONE ACETATE 5 MG PO TABS: 10.0000 mg | ORAL_TABLET | Freq: Every day | ORAL | 0 refills | Status: AC

## 2022-01-07 NOTE — ED Triage Notes (Signed)
Pt states she got off period one week ago and started bleeding again yesterday. Pt states she has used 5 super tampons in the last 1.5hours.

## 2022-01-07 NOTE — ED Provider Notes (Signed)
[facility] EMERGENCY DEPARTMENT Provider Note   CSN: [id number] Arrival date & time: 01/07/22  0108     History  Chief Complaint  Patient presents with   Vaginal Bleeding    Kathy Green is a 21 y.o. female.  21 yo F here with vaginal bleeding.  Patient states that she had a heavy bleeding started today.  States she had a period last week.  She is last intercourse on Friday night recently.  Patient states that 7 past and she is going on birth control which has helped some but has really helped her heavy periods.  She has OB follow-up scheduled.  She states that she Aquanil HC with clots and clots significantly bigger as well.  She presents here for evaluation.  She does not feel lightheaded, weak, fatigued or any skin color changes.  No vaginal pain, discharge or other associated symptoms.   Vaginal Bleeding      Home Medications Prior to Admission medications   Medication Sig Start Date End Date Taking? Authorizing Provider  medroxyPROGESTERone (PROVERA) 5 MG tablet Take 2 tablets (10 mg total) by mouth daily for 7 days. 01/07/22 01/14/22 Yes Azlan Hanway, Barbara Cower, MD  acetaminophen (TYLENOL) 325 MG tablet Take 2 tablets (650 mg total) by mouth every 6 (six) hours as needed for mild pain or moderate pain. 12/29/18   Reasor, [place], MD  albuterol (VENTOLIN HFA) 108 (90 Base) MCG/ACT inhaler Inhale 2 puffs into the lungs every 6 (six) hours as needed for wheezing or shortness of breath.     [provider]  BLISOVI FE 1/20 1-20 MG-MCG tablet Take 1 tablet by mouth daily. 07/30/21   [provider]  diphenhydrAMINE (BENADRYL) 25 MG tablet Take 1 tablet (25 mg total) by mouth every 6 (six) hours as needed for itching. Patient not taking: Reported on 08/06/2021 11/15/17   Loren Racer, MD  docusate sodium (COLACE) 100 MG capsule Take 1 capsule (100 mg total) by mouth 2 (two) times daily. Patient not taking: Reported on 08/06/2021 12/30/18   Reasor, [place], MD  ibuprofen (ADVIL)  800 MG tablet Take 1 tablet (800 mg total) by mouth every 8 (eight) hours as needed. Patient taking differently: Take 800 mg by mouth every 8 (eight) hours as needed for fever or mild pain. 01/23/19   Bethann Berkshire, MD  metroNIDAZOLE (FLAGYL) 500 MG tablet Take 1 tablet (500 mg total) by mouth 2 (two) times daily. 08/06/21   Bethann Berkshire, MD  naproxen (NAPROSYN) 500 MG tablet Take 1 tablet (500 mg total) by mouth 2 (two) times daily with a meal. Patient not taking: Reported on 08/06/2021 05/09/19   Eber Hong, MD  pantoprazole (PROTONIX) 40 MG tablet Take 1 tablet (40 mg total) by mouth daily. 01/02/19 02/01/19  Alexander Mt, MD  predniSONE (DELTASONE) 20 MG tablet Take 2 tablets (40 mg total) by mouth daily with breakfast. 11/05/21   Particia Nearing, PA-C  promethazine-dextromethorphan (PROMETHAZINE-DM) 6.25-15 MG/5ML syrup Take 5 mLs by mouth 4 (four) times daily as needed. 11/05/21   Particia Nearing, PA-C      Allergies    Amoxicillin-pot clavulanate and Penicillins    Review of Systems   Review of Systems  Genitourinary:  Positive for vaginal bleeding.    Physical Exam Updated Vital Signs BP 118/87   Pulse 89   Temp 98.2 F (36.8 C) (Oral)   Resp 15   Ht 5\' 7"  (1.702 m)   Wt 127 kg   LMP 12/22/2021  SpO2 96%   BMI 43.85 kg/m  Physical Exam Vitals and nursing note reviewed.  Constitutional:      Appearance: She is well-developed.  HENT:     Head: Normocephalic and atraumatic.     Mouth/Throat:     Mouth: Mucous membranes are moist.     Pharynx: Oropharynx is clear.  Eyes:     Pupils: Pupils are equal, round, and reactive to light.  Cardiovascular:     Rate and Rhythm: Normal rate and regular rhythm.  Pulmonary:     Effort: No respiratory distress.     Breath sounds: No stridor.  Abdominal:     General: Abdomen is flat. There is no distension.  Genitourinary:    Comments: deferred Musculoskeletal:     Cervical back: Normal range of motion.   Skin:    General: Skin is warm and dry.  Neurological:     General: No focal deficit present.     Mental Status: She is alert.     ED Results / Procedures / Treatments   Labs (all labs ordered are listed, but only abnormal results are displayed) Labs Reviewed  CBC WITH DIFFERENTIAL/PLATELET - Abnormal; Notable for the following components:      Result Value   WBC 10.9 (*)    All other components within normal limits  BASIC METABOLIC PANEL - Abnormal; Notable for the following components:   Glucose, Bld 105 (*)    All other components within normal limits  POC URINE PREG, ED  TYPE AND SCREEN    EKG None  Radiology No results found.  Procedures Procedures    Medications Ordered in ED Medications  megestrol (MEGACE) tablet 160 mg (160 mg Oral Given 01/07/22 0333)  lactated ringers bolus 1,000 mL (0 mLs Intravenous Stopped 01/07/22 0510)    ED Course/ Medical Decision Making/ A&P                           Medical Decision Making Amount and/or Complexity of Data Reviewed Labs: ordered.  Risk Prescription drug management.   No evidence of hemorrhagic shock or impending hemorrhagic shock.  Bleeding slowed down while in the ED.  We will continue medications at home with OB follow-up as scheduled.  No indication for further work-up at this time. Doubt Vaginal injury without recent intercourse.   Final Clinical Impression(s) / ED Diagnoses Final diagnoses:  Vaginal bleeding    Rx / DC Orders ED Discharge Orders          Ordered    medroxyPROGESTERone (PROVERA) 5 MG tablet  Daily        01/07/22 0559              Brinden Kincheloe, Barbara Cower, MD 01/07/22 [phone]

## 2022-02-16 ENCOUNTER — Ambulatory Visit
Admission: EM | Admit: 2022-02-16 | Discharge: 2022-02-16 | Disposition: A | Discharge status: Home / Self Care | Payer: Medicaid Other | Attending: Family Medicine | Admitting: Family Medicine

## 2022-02-16 ENCOUNTER — Ambulatory Visit (INDEPENDENT_AMBULATORY_CARE_PROVIDER_SITE_OTHER): Payer: Medicaid Other

## 2022-02-16 DIAGNOSIS — W19XXXA Unspecified fall, initial encounter: Secondary | ICD-10-CM | POA: Diagnosis not present

## 2022-02-16 DIAGNOSIS — M25511 Pain in right shoulder: Secondary | ICD-10-CM

## 2022-02-16 MED ORDER — NAPROXEN 500 MG PO TABS: 500.0000 mg | ORAL_TABLET | Freq: Two times a day (BID) | ORAL | 0 refills | Status: AC | PRN

## 2022-02-16 MED ORDER — CYCLOBENZAPRINE HCL 10 MG PO TABS: 10.0000 mg | ORAL_TABLET | Freq: Three times a day (TID) | ORAL | 0 refills | Status: AC | PRN

## 2022-02-16 NOTE — ED Triage Notes (Signed)
Pt presents with right shoulder pain after a fall she had. Pt states she landed face forward.

## 2022-02-16 NOTE — ED Provider Notes (Signed)
[facility]    CSN: [id number] Arrival date & time: 02/16/22  1154      History   Chief Complaint Chief Complaint  Patient presents with   Shoulder Pain   Fall    HPI Kathy Green is a 21 y.o. female.   Patient presenting today with 2-day history of right anterior shoulder pain, stiffness extending toward collarbone.  She states she was doing some work for somebody and tripped over a railroad tie, falling forward.  She states she tried to catch herself with her right hand, causing a jarring motion to her shoulder.  She felt like she heard a crack but is unsure.  She does have some motion in the right shoulder but it is very painful and stiff.  So far has not tried anything over-the-counter for symptoms.  Denies weakness, numbness, tingling, bruising, swelling.     Past Medical History:  Diagnosis Date   ADHD (attention deficit hyperactivity disorder)    Anxiety    Asthma    Depression    Irregular bleeding 09/13/2015   Ovarian cyst    PONV (postoperative nausea and vomiting)    Vaginal discharge 09/13/2015   Vitamin D deficiency 09/17/2015   Yeast infection 09/13/2015    Patient Active Problem List   Diagnosis Date Noted   Constipation 01/01/2019   Vomiting 12/28/2018   Abdominal pain 12/28/2018   MDD (major depressive disorder), recurrent episode, severe (HCC) 08/04/2018   Vitamin D deficiency 09/17/2015   Vaginal discharge 09/13/2015   Yeast infection 09/13/2015   Irregular bleeding 09/13/2015   GROIN PAIN 03/13/2010   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    Past Surgical History:  Procedure Laterality Date   ADENOIDECTOMY     TONSILLECTOMY     TOOTH EXTRACTION Bilateral 09/09/2018   Procedure: EXTRACTION 3RD MOLARS;  Surgeon: Ocie Doyne, DDS;  Location: [facility] OR;  Service: Oral Surgery;  Laterality: Bilateral;  EXTRACTION 3RD MOLARS    OB History     Gravida  0   Para  0   Term  0   Preterm  0   AB  0   Living  0      SAB  0   IAB  0    Ectopic  0   Multiple  0   Live Births               Home Medications    Prior to Admission medications   Medication Sig Start Date End Date Taking? Authorizing Provider  cyclobenzaprine (FLEXERIL) 10 MG tablet Take 1 tablet (10 mg total) by mouth 3 (three) times daily as needed for muscle spasms. Do not drink alcohol or drive while taking this medication.  May cause drowsiness. 02/16/22  Yes Particia Nearing, PA-C  naproxen (NAPROSYN) 500 MG tablet Take 1 tablet (500 mg total) by mouth 2 (two) times daily as needed. 02/16/22  Yes Particia Nearing, PA-C  acetaminophen (TYLENOL) 325 MG tablet Take 2 tablets (650 mg total) by mouth every 6 (six) hours as needed for mild pain or moderate pain. 12/29/18   Reasor, [place], MD  albuterol (VENTOLIN HFA) 108 (90 Base) MCG/ACT inhaler Inhale 2 puffs into the lungs every 6 (six) hours as needed for wheezing or shortness of breath.     [provider]  BLISOVI FE 1/20 1-20 MG-MCG tablet Take 1 tablet by mouth daily. 07/30/21   [provider]  diphenhydrAMINE (BENADRYL) 25 MG tablet Take 1 tablet (25 mg total) by  mouth every 6 (six) hours as needed for itching. Patient not taking: Reported on 08/06/2021 11/15/17   Loren Racer, MD  docusate sodium (COLACE) 100 MG capsule Take 1 capsule (100 mg total) by mouth 2 (two) times daily. Patient not taking: Reported on 08/06/2021 12/30/18   Reasor, [place], MD  ibuprofen (ADVIL) 800 MG tablet Take 1 tablet (800 mg total) by mouth every 8 (eight) hours as needed. Patient taking differently: Take 800 mg by mouth every 8 (eight) hours as needed for fever or mild pain. 01/23/19   Bethann Berkshire, MD  medroxyPROGESTERone (PROVERA) 5 MG tablet Take 2 tablets (10 mg total) by mouth daily for 7 days. 01/07/22 01/14/22  Mesner, Barbara Cower, MD  metroNIDAZOLE (FLAGYL) 500 MG tablet Take 1 tablet (500 mg total) by mouth 2 (two) times daily. 08/06/21   Bethann Berkshire, MD  naproxen (NAPROSYN) 500 MG tablet Take  1 tablet (500 mg total) by mouth 2 (two) times daily with a meal. Patient not taking: Reported on 08/06/2021 05/09/19   Eber Hong, MD  pantoprazole (PROTONIX) 40 MG tablet Take 1 tablet (40 mg total) by mouth daily. 01/02/19 02/01/19  Alexander Mt, MD  predniSONE (DELTASONE) 20 MG tablet Take 2 tablets (40 mg total) by mouth daily with breakfast. 11/05/21   Particia Nearing, PA-C  promethazine-dextromethorphan (PROMETHAZINE-DM) 6.25-15 MG/5ML syrup Take 5 mLs by mouth 4 (four) times daily as needed. 11/05/21   Particia Nearing, PA-C    Family History Family History  Problem Relation Age of Onset   Hodgkin's lymphoma Mother        in remission   Depression Mother    Anxiety disorder Mother    Hypertension Mother    Bipolar disorder Mother    Arthritis Mother    Hypertension Father    Depression Father    Heart attack Father    ADD / ADHD Brother    Learning disabilities Brother    Other Maternal Grandmother        degenerative disc disease   Hypertension Maternal Grandmother    Arthritis Maternal Grandmother    Cancer Maternal Grandmother        skin   Heart disease Maternal Grandfather    Diabetes Maternal Grandfather    Hypertension Maternal Grandfather    Depression Paternal Grandmother    Hypertension Paternal Grandmother    Diabetes Paternal Grandmother    Thyroid cancer Paternal Grandmother    Kidney disease Paternal Grandmother    Asthma Paternal Grandmother    COPD Paternal Grandfather    Other Other        renal failure   Cancer Other        ovarian    Social History Social History   Tobacco Use   Smoking status: Never   Smokeless tobacco: Never  Vaping Use   Vaping Use: Every day   Substances: Nicotine  Substance Use Topics   Alcohol use: No   Drug use: No     Allergies   Amoxicillin-pot clavulanate and Penicillins  Review of Systems Review of Systems Per HPI  Physical Exam Triage Vital Signs ED Triage Vitals  Enc Vitals  Group     BP 02/16/22 1238 119/79     Pulse Rate 02/16/22 1238 70     Resp 02/16/22 1238 20     Temp --      Temp Source 02/16/22 1238 Oral     SpO2 02/16/22 1238 98 %     Weight --  Height --      Head Circumference --      Peak Flow --      Pain Score 02/16/22 1236 8     Pain Loc --      Pain Edu? --      Excl. in GC? --    No data found.  Updated Vital Signs BP 119/79 (BP Location: Right Arm)   Pulse 70   Resp 20   LMP 01/23/2022 (Exact Date)   SpO2 98%   Visual Acuity Right Eye Distance:   Left Eye Distance:   Bilateral Distance:    Right Eye Near:   Left Eye Near:    Bilateral Near:     Physical Exam Vitals and nursing note reviewed.  Constitutional:      Appearance: Normal appearance. She is not ill-appearing.  HENT:     Head: Atraumatic.  Eyes:     Extraocular Movements: Extraocular movements intact.     Conjunctiva/sclera: Conjunctivae normal.  Cardiovascular:     Rate and Rhythm: Normal rate and regular rhythm.     Heart sounds: Normal heart sounds.  Pulmonary:     Effort: Pulmonary effort is normal.     Breath sounds: Normal breath sounds.  Musculoskeletal:        General: Tenderness and signs of injury present. No swelling or deformity.     Cervical back: Normal range of motion and neck supple.     Comments: Range of motion appears intact but guarded due to pain right upper extremity at the shoulder.  Tenderness to palpation diffusely right anterior shoulder extending to clavicle with no bony deformity palpable.  Grip strength full and equal bilateral hands.  Skin:    General: Skin is warm and dry.     Findings: No bruising.  Neurological:     Mental Status: She is alert and oriented to person, place, and time.     Motor: No weakness.     Gait: Gait normal.     Comments: Right upper extremity neurovascularly intact  Psychiatric:        Mood and Affect: Mood normal.        Thought Content: Thought content normal.        Judgment: Judgment  normal.      UC Treatments / Results  Labs (all labs ordered are listed, but only abnormal results are displayed) Labs Reviewed - No data to display  EKG   Radiology DG Shoulder Right  Result Date: 02/16/2022 CLINICAL DATA:  RIGHT shoulder pain post fall EXAM: RIGHT SHOULDER - 2+ VIEW COMPARISON:  None FINDINGS: Osseous mineralization normal. AC joint alignment normal. Visualized RIGHT ribs intact. No acute fracture, dislocation, or bone destruction. IMPRESSION: Normal exam. Electronically Signed   By: Ulyses Southward M.D.   On: 02/16/2022 13:18    Procedures Procedures (including critical care time)  Medications Ordered in UC Medications - No data to display  Initial Impression / Assessment and Plan / UC Course  I have reviewed the triage vital signs and the nursing notes.  Pertinent labs & imaging results that were available during my care of the patient were reviewed by me and considered in my medical decision making (see chart for details).     Exam very reassuring, x-ray of the right shoulder negative for acute bony abnormality.  Treat with Flexeril, naproxen, heat, stretches, rest.  Arm sling given for comfort while up and moving over the next few days but discussed to take it off and  do gentle range of motion exercises and stretches on and off throughout the day.  Final Clinical Impressions(s) / UC Diagnoses   Final diagnoses:  Acute pain of right shoulder   Discharge Instructions   None    ED Prescriptions     Medication Sig Dispense Auth. Provider   naproxen (NAPROSYN) 500 MG tablet Take 1 tablet (500 mg total) by mouth 2 (two) times daily as needed. 15 tablet Volney American, [place]   cyclobenzaprine (FLEXERIL) 10 MG tablet Take 1 tablet (10 mg total) by mouth 3 (three) times daily as needed for muscle spasms. Do not drink alcohol or drive while taking this medication.  May cause drowsiness. 15 tablet Volney American, [place]      PDMP not reviewed  this encounter.   Volney American, [place] 02/16/22 1415
